# Patient Record
Sex: Female | Born: 1980 | Race: Black or African American | Hispanic: No | Marital: Single | Smoking: Former smoker
Health system: Southern US, Community
[De-identification: ages and names within clinical notes are randomized; demographics above are authoritative.]

## PROBLEM LIST (undated history)

## (undated) DIAGNOSIS — O24419 Gestational diabetes mellitus in pregnancy, unspecified control: Secondary | ICD-10-CM

## (undated) DIAGNOSIS — I10 Essential (primary) hypertension: Secondary | ICD-10-CM

## (undated) HISTORY — DX: Gestational diabetes mellitus in pregnancy, unspecified control: O24.419

## (undated) HISTORY — DX: Essential (primary) hypertension: I10

## (undated) HISTORY — PX: APPENDECTOMY: SHX54

---

## 2015-11-03 DIAGNOSIS — L680 Hirsutism: Secondary | ICD-10-CM | POA: Insufficient documentation

## 2017-08-27 ENCOUNTER — Encounter: Payer: Self-pay | Admitting: *Deleted

## 2017-09-15 ENCOUNTER — Ambulatory Visit (INDEPENDENT_AMBULATORY_CARE_PROVIDER_SITE_OTHER): Payer: Medicaid - Out of State | Admitting: Obstetrics and Gynecology

## 2017-09-15 ENCOUNTER — Ambulatory Visit: Payer: Self-pay

## 2017-09-15 ENCOUNTER — Other Ambulatory Visit (HOSPITAL_COMMUNITY)
Admission: RE | Admit: 2017-09-15 | Discharge: 2017-09-15 | Disposition: A | Discharge status: Home / Self Care | Payer: Medicaid - Out of State | Source: Ambulatory Visit | Attending: Obstetrics and Gynecology | Admitting: Obstetrics and Gynecology

## 2017-09-15 ENCOUNTER — Encounter: Payer: Self-pay | Admitting: Obstetrics and Gynecology

## 2017-09-15 VITALS — BP 129/73 | HR 92 | Wt 311.0 lb

## 2017-09-15 DIAGNOSIS — O09522 Supervision of elderly multigravida, second trimester: Secondary | ICD-10-CM | POA: Insufficient documentation

## 2017-09-15 DIAGNOSIS — Z3687 Encounter for antenatal screening for uncertain dates: Secondary | ICD-10-CM

## 2017-09-15 DIAGNOSIS — Z3482 Encounter for supervision of other normal pregnancy, second trimester: Secondary | ICD-10-CM | POA: Diagnosis present

## 2017-09-15 DIAGNOSIS — Z3A17 17 weeks gestation of pregnancy: Secondary | ICD-10-CM | POA: Insufficient documentation

## 2017-09-15 DIAGNOSIS — O09299 Supervision of pregnancy with other poor reproductive or obstetric history, unspecified trimester: Secondary | ICD-10-CM

## 2017-09-15 DIAGNOSIS — O3680X0 Pregnancy with inconclusive fetal viability, not applicable or unspecified: Secondary | ICD-10-CM | POA: Diagnosis not present

## 2017-09-15 DIAGNOSIS — O99212 Obesity complicating pregnancy, second trimester: Secondary | ICD-10-CM

## 2017-09-15 DIAGNOSIS — O99210 Obesity complicating pregnancy, unspecified trimester: Secondary | ICD-10-CM

## 2017-09-15 DIAGNOSIS — Z8632 Personal history of gestational diabetes: Secondary | ICD-10-CM | POA: Insufficient documentation

## 2017-09-15 DIAGNOSIS — Z3400 Encounter for supervision of normal first pregnancy, unspecified trimester: Secondary | ICD-10-CM | POA: Insufficient documentation

## 2017-09-15 DIAGNOSIS — O09292 Supervision of pregnancy with other poor reproductive or obstetric history, second trimester: Secondary | ICD-10-CM | POA: Diagnosis not present

## 2017-09-15 DIAGNOSIS — Z98891 History of uterine scar from previous surgery: Secondary | ICD-10-CM

## 2017-09-15 DIAGNOSIS — Z3402 Encounter for supervision of normal first pregnancy, second trimester: Secondary | ICD-10-CM | POA: Diagnosis present

## 2017-09-15 NOTE — Progress Notes (Signed)
Pt informed that the ultrasound is considered a limited OB ultrasound and is not intended to be a complete ultrasound exam.  Patient also informed that the ultrasound is not being completed with the intent of assessing for fetal or placental anomalies or any pelvic abnormalities.  Explained that the purpose of today's ultrasound is to assess for  viability.  Patient acknowledges the purpose of the exam and the limitations of the study.    

## 2017-09-15 NOTE — Progress Notes (Signed)
INITIAL PRENATAL VISIT NOTE  Subjective:  Miranda Ruiz is a 37 y.o. G2P1001 at [redacted]w[redacted]d by LMP confirmed by [name] today being seen today for her initial prenatal visit. This is an unplanned pregnancy. She and partner are happy with the pregnancy. She was using Mirena for birth control previously, taken out 04/2016. She has an obstetric history significant for 1CS with GDMA2 on insulin. She has a medical history significant for n/a.  Patient reports pelvic pressure.  Contractions: Not present. Vag. Bleeding: None.  Movement: Present. Denies leaking of fluid.   Past Medical History:  Diagnosis Date  . Gestational diabetes   on insulin  Past Surgical History:  Procedure Laterality Date  . APPENDECTOMY    . CESAREAN SECTION      OB History  Gravida Para Term Preterm AB Living  2 1 1  0 0 1  SAB TAB Ectopic Multiple Live Births  0 0 0 0 1    # Outcome Date GA Lbr Len/2nd Weight Sex Delivery Anes PTL Lv  2 Current           1 Term 2008     CS-Unspec   LIV    Social History   Socioeconomic History  . Marital status: Single    Spouse name: Not on file  . Number of children: Not on file  . Years of education: Not on file  . Highest education level: Not on file  Occupational History  . Not on file  Social Needs  . Financial resource strain: Not on file  . Food insecurity:    Worry: Not on file    Inability: Not on file  . Transportation needs:    Medical: Not on file    Non-medical: Not on file  Tobacco Use  . Smoking status: Former Smoker    Last attempt to quit: 05/18/2017    Years since quitting: 0.3  . Smokeless tobacco: Never Used  Substance and Sexual Activity  . Alcohol use: Never    Frequency: Never  . Drug use: Never  . Sexual activity: Yes    Birth control/protection: None  Lifestyle  . Physical activity:    Days per week: Not on file    Minutes per session: Not on file  . Stress: Not on file  Relationships  . Social connections:    Talks on phone: Not on  file    Gets together: Not on file    Attends religious service: Not on file    Active member of club or organization: Not on file    Attends meetings of clubs or organizations: Not on file    Relationship status: Not on file  Other Topics Concern  . Not on file  Social History Narrative  . Not on file   Family History  Problem Relation Age of Onset  . Cancer Maternal Aunt     (Not in a hospital admission)  Not on File  Review of Systems: Negative except for what is mentioned in HPI.  Objective:   Vitals:   09/15/17 0916  BP: 129/73  Pulse: 92  Weight: (!) 311 lb (141.1 kg)   Fetal Status:     Movement: Present   17w4 day by [name] today, FHR 150s  Physical Exam: BP 129/73   Pulse 92   Wt (!) 311 lb (141.1 kg)   LMP 05/18/2017 (Approximate)  CONSTITUTIONAL: Well-developed, well-nourished female in no acute distress.  NEUROLOGIC: Alert and oriented to person, place, and time. Normal reflexes, muscle  tone coordination. No cranial nerve deficit noted. PSYCHIATRIC: Normal mood and affect. Normal behavior. Normal judgment and thought content. SKIN: Skin is warm and dry. No rash noted. Not diaphoretic. No erythema. No pallor. HENT:  Normocephalic, atraumatic, External right and left ear normal. Oropharynx is clear and moist EYES: Conjunctivae and EOM are normal. Pupils are equal, round, and reactive to light. No scleral icterus.  NECK: Normal range of motion, supple, no masses CARDIOVASCULAR: Normal heart rate noted, regular rhythm RESPIRATORY: Effort and breath sounds normal, no problems with respiration noted BREASTS: symmetric, non-tender, no masses palpable ABDOMEN: Soft, nontender, nondistended, gravid. GU: normal appearing external female genitalia, multiparous normal appearing cervix, scant white discharge in vagina, no lesions noted Bimanual: difficult to palpate uterus due to habitus MUSCULOSKELETAL: Normal range of motion. EXT:  No edema and no tenderness. 2+ distal  pulses.   Assessment and Plan:  Pregnancy: G1P0 at 602w1d by LMP confirmed by US today  1. Encounter to determine fetal viability of pregnancy, single or unspecified fetus - US OB Limited; Future  1. Supervision of normal first pregnancy, antepartum - [facility] AMB BABYSCRIPTS OPT IN - Culture, OB Urine - Cystic fibrosis gene test - Hemoglobinopathy Evaluation - Obstetric Panel, Including HIV - SMN1 COPY NUMBER ANALYSIS (SMA Carrier Screen) - Genetic Screening  2. H/O gestational diabetes in prior pregnancy, currently pregnant On insulin Early A1C  3. Obesity during pregnancy  4. H/O: C-section States didn't dilate Would like RCS    Preterm labor symptoms and general obstetric precautions including but not limited to vaginal bleeding, contractions, leaking of fluid and fetal movement were reviewed in detail with the patient.  Please refer to After Visit Summary for other counseling recommendations.   Return in about 1 month (around 10/13/2017) for OB visit (MD).  Conan BowensKelly M Nizar Cutler 09/15/2017 1:56 PM

## 2017-09-16 ENCOUNTER — Encounter (HOSPITAL_COMMUNITY): Payer: Self-pay

## 2017-09-16 LAB — CYTOLOGY - PAP
CHLAMYDIA, DNA PROBE: NEGATIVE
Diagnosis: NEGATIVE
HPV: NOT DETECTED
Neisseria Gonorrhea: NEGATIVE

## 2017-09-17 LAB — CULTURE, OB URINE

## 2017-09-17 LAB — URINE CULTURE, OB REFLEX

## 2017-09-23 LAB — SMN1 COPY NUMBER ANALYSIS (SMA CARRIER SCREENING)

## 2017-09-23 LAB — HEMOGLOBINOPATHY EVALUATION
Ferritin: 122 ng/mL (ref 15–150)
HGB A: 97.6 % (ref 96.4–98.8)
HGB C: 0 %
HGB S: 0 %
HGB VARIANT: 0 %
Hgb A2 Quant: 2.4 % (ref 1.8–3.2)
Hgb F Quant: 0 % (ref 0.0–2.0)
Hgb Solubility: NEGATIVE

## 2017-09-23 LAB — OBSTETRIC PANEL, INCLUDING HIV
ANTIBODY SCREEN: NEGATIVE
Basophils Absolute: 0 10*3/uL (ref 0.0–0.2)
Basos: 0 %
EOS (ABSOLUTE): 0.1 10*3/uL (ref 0.0–0.4)
EOS: 2 %
HEMOGLOBIN: 10.9 g/dL — AB (ref 11.1–15.9)
HIV Screen 4th Generation wRfx: NONREACTIVE
Hematocrit: 32.7 % — ABNORMAL LOW (ref 34.0–46.6)
Hepatitis B Surface Ag: NEGATIVE
IMMATURE GRANS (ABS): 0 10*3/uL (ref 0.0–0.1)
Immature Granulocytes: 0 %
LYMPHS ABS: 1.4 10*3/uL (ref 0.7–3.1)
LYMPHS: 25 %
MCH: 29.9 pg (ref 26.6–33.0)
MCHC: 33.3 g/dL (ref 31.5–35.7)
MCV: 90 fL (ref 79–97)
MONOS ABS: 0.3 10*3/uL (ref 0.1–0.9)
Monocytes: 6 %
Neutrophils Absolute: 3.6 10*3/uL (ref 1.4–7.0)
Neutrophils: 67 %
Platelets: 265 10*3/uL (ref 150–450)
RBC: 3.64 x10E6/uL — AB (ref 3.77–5.28)
RDW: 12.9 % (ref 12.3–15.4)
RH TYPE: POSITIVE
RPR Ser Ql: NONREACTIVE
Rubella Antibodies, IGG: 2.34 index (ref >0.99)
WBC: 5.4 10*3/uL (ref 3.4–10.8)

## 2017-09-23 LAB — CYSTIC FIBROSIS GENE TEST

## 2017-09-23 LAB — HEMOGLOBIN A1C
ESTIMATED AVERAGE GLUCOSE: 131 mg/dL
Hgb A1c MFr Bld: 6.2 % — ABNORMAL HIGH (ref 4.8–5.6)

## 2017-09-24 ENCOUNTER — Ambulatory Visit (HOSPITAL_COMMUNITY)
Admission: RE | Admit: 2017-09-24 | Discharge: 2017-09-24 | Disposition: A | Discharge status: Home / Self Care | Payer: Medicaid - Out of State | Source: Ambulatory Visit | Attending: Obstetrics and Gynecology | Admitting: Obstetrics and Gynecology

## 2017-09-24 ENCOUNTER — Encounter (HOSPITAL_COMMUNITY): Payer: Self-pay

## 2017-09-24 ENCOUNTER — Other Ambulatory Visit (HOSPITAL_COMMUNITY): Payer: Self-pay | Admitting: *Deleted

## 2017-09-24 ENCOUNTER — Other Ambulatory Visit: Payer: Self-pay | Admitting: Obstetrics and Gynecology

## 2017-09-24 DIAGNOSIS — Z98891 History of uterine scar from previous surgery: Secondary | ICD-10-CM

## 2017-09-24 DIAGNOSIS — Z3A18 18 weeks gestation of pregnancy: Secondary | ICD-10-CM | POA: Diagnosis not present

## 2017-09-24 DIAGNOSIS — O09299 Supervision of pregnancy with other poor reproductive or obstetric history, unspecified trimester: Secondary | ICD-10-CM

## 2017-09-24 DIAGNOSIS — O3680X0 Pregnancy with inconclusive fetal viability, not applicable or unspecified: Secondary | ICD-10-CM

## 2017-09-24 DIAGNOSIS — Z363 Encounter for antenatal screening for malformations: Secondary | ICD-10-CM | POA: Diagnosis not present

## 2017-09-24 DIAGNOSIS — O09522 Supervision of elderly multigravida, second trimester: Secondary | ICD-10-CM

## 2017-09-24 DIAGNOSIS — O99212 Obesity complicating pregnancy, second trimester: Secondary | ICD-10-CM | POA: Diagnosis not present

## 2017-10-13 ENCOUNTER — Encounter: Payer: Medicaid - Out of State | Admitting: Advanced Practice Midwife

## 2017-10-13 ENCOUNTER — Other Ambulatory Visit: Payer: Medicaid - Out of State

## 2017-10-15 ENCOUNTER — Encounter: Payer: Self-pay | Admitting: *Deleted

## 2017-10-20 ENCOUNTER — Other Ambulatory Visit: Payer: Self-pay | Admitting: *Deleted

## 2017-10-20 DIAGNOSIS — Z8632 Personal history of gestational diabetes: Secondary | ICD-10-CM

## 2017-10-20 DIAGNOSIS — O09299 Supervision of pregnancy with other poor reproductive or obstetric history, unspecified trimester: Secondary | ICD-10-CM

## 2017-10-20 DIAGNOSIS — O99210 Obesity complicating pregnancy, unspecified trimester: Secondary | ICD-10-CM

## 2017-10-20 DIAGNOSIS — Z3400 Encounter for supervision of normal first pregnancy, unspecified trimester: Secondary | ICD-10-CM

## 2017-10-24 ENCOUNTER — Other Ambulatory Visit: Payer: Medicaid - Out of State

## 2017-10-24 ENCOUNTER — Ambulatory Visit (INDEPENDENT_AMBULATORY_CARE_PROVIDER_SITE_OTHER): Payer: Medicaid - Out of State | Admitting: Advanced Practice Midwife

## 2017-10-24 ENCOUNTER — Encounter: Payer: Self-pay | Admitting: General Practice

## 2017-10-24 ENCOUNTER — Encounter: Payer: Self-pay | Admitting: Advanced Practice Midwife

## 2017-10-24 VITALS — BP 122/69 | HR 93 | Wt 314.1 lb

## 2017-10-24 DIAGNOSIS — R7303 Prediabetes: Secondary | ICD-10-CM

## 2017-10-24 DIAGNOSIS — O99210 Obesity complicating pregnancy, unspecified trimester: Secondary | ICD-10-CM

## 2017-10-24 DIAGNOSIS — O09529 Supervision of elderly multigravida, unspecified trimester: Secondary | ICD-10-CM

## 2017-10-24 DIAGNOSIS — Z8632 Personal history of gestational diabetes: Secondary | ICD-10-CM

## 2017-10-24 DIAGNOSIS — Z3400 Encounter for supervision of normal first pregnancy, unspecified trimester: Secondary | ICD-10-CM

## 2017-10-24 DIAGNOSIS — Z98891 History of uterine scar from previous surgery: Secondary | ICD-10-CM

## 2017-10-24 DIAGNOSIS — O09299 Supervision of pregnancy with other poor reproductive or obstetric history, unspecified trimester: Secondary | ICD-10-CM

## 2017-10-24 NOTE — Progress Notes (Signed)
   PRENATAL VISIT NOTE  Subjective:  Miranda Ruiz is a 37 y.o. G2P1001 at 5459w5d being seen today for ongoing prenatal care.  She is currently monitored for the following issues for this low-risk pregnancy and has Supervision of normal first pregnancy, antepartum; Obesity during pregnancy; H/O: C-section; H/O gestational diabetes in prior pregnancy, currently pregnant; and Advanced maternal age in multigravida on their problem list.  Patient reports no complaints. States she noticed "fishy" smell and dark urine yesterday but "drank more water and now urine looks normal". Contractions: Not present. Vag. Bleeding: None.  Movement: Present. Denies leaking of fluid.   The following portions of the patient's history were reviewed and updated as appropriate: allergies, current medications, past family history, past medical history, past social history, past surgical history and problem list. Problem list updated.  Objective:   Vitals:   10/24/17 0832  BP: 122/69  Pulse: 93  Weight: (!) 314 lb 1.6 oz (142.5 kg)    Fetal Status: Fetal Heart Rate (bpm): 146 Fundal Height: 23 cm Movement: Present     General:  Alert, oriented and cooperative. Patient is in no acute distress.  Skin: Skin is warm and dry. No rash noted.   Cardiovascular: Normal heart rate noted  Respiratory: Normal respiratory effort, no problems with respiration noted  Abdomen: Soft, gravid, appropriate for gestational age.  Pain/Pressure: Absent     Pelvic: Cervical exam deferred        Extremities: Normal range of motion.  Edema: Trace  Mental Status: Normal mood and affect. Normal behavior. Normal judgment and thought content.   Assessment and Plan:  Pregnancy: G2P1001 at 2959w5d  1. Prediabetes Early HbA1C results show as prediabetic, discussed following A2GDM diet from previous pregnancy 2 hour GTT today   2. Antepartum multigravida of advanced maternal age - Concentrated urine r/t low water intake, now resolved - AFP,  Serum, Open Spina Bifida - Followup US 11/05/2017  3. Obesity in pregnancy   4. Previous cesarean section - Continues to desire RCS  5. History of gestational diabetes - Previously A2GDM on insulin  Preterm labor symptoms and general obstetric precautions including but not limited to vaginal bleeding, contractions, leaking of fluid and fetal movement were reviewed in detail with the patient. Please refer to After Visit Summary for other counseling recommendations.  No follow-ups on file.  Future Appointments  Date Time Provider Department Center  11/05/2017 10:30 AM [facility] US 1 [facility]    Calvert CantorSamantha C Zahra Peffley, CNM  10/24/17  8:54 AM

## 2017-10-24 NOTE — Progress Notes (Signed)
Pt states yesetrday when urinating she said her urine smelled fishy & was darker than usual. She drunk more water and has noticed the smell & urine is no longer dark.

## 2017-10-24 NOTE — Patient Instructions (Signed)
Prediabetes Prediabetes is the condition of having a blood sugar (blood glucose) level that is higher than it should be, but not high enough for you to be diagnosed with type 2 diabetes. Having prediabetes puts you at risk for developing type 2 diabetes (type 2 diabetes mellitus). Prediabetes may be called impaired glucose tolerance or impaired fasting glucose. Prediabetes usually does not cause symptoms. Your health care provider can diagnose this condition with blood tests. You may be tested for prediabetes if you are overweight and if you have at least one other risk factor for prediabetes. Risk factors for prediabetes include:  Having a family member with type 2 diabetes.  Being overweight or obese.  Being older than age 45.  Being of American-Indian, African-American, Hispanic/Latino, or Asian/Pacific Islander descent.  Having an inactive (sedentary) lifestyle.  Having a history of gestational diabetes or polycystic ovarian syndrome (PCOS).  Having low levels of good cholesterol (HDL-C) or high levels of blood fats (triglycerides).  Having high blood pressure.  What is blood glucose and how is blood glucose measured?  Blood glucose refers to the amount of glucose in your bloodstream. Glucose comes from eating foods that contain sugars and starches (carbohydrates) that the body breaks down into glucose. Your blood glucose level may be measured in mg/dL (milligrams per deciliter) or mmol/L (millimoles per liter).Your blood glucose may be checked with one or more of the following blood tests:  A fasting blood glucose (FBG) test. You will not be allowed to eat (you will fast) for at least 8 hours before a blood sample is taken. ? A normal range for FBG is 70-100 mg/dl (3.9-5.6 mmol/L).  An A1c (hemoglobin A1c) blood test. This test provides information about blood glucose control over the previous 2?3months.  An oral glucose tolerance test (OGTT). This test measures your blood  glucose twice: ? After fasting. This is your baseline level. ? Two hours after you drink a beverage that contains glucose.  You may be diagnosed with prediabetes:  If your FBG is 100?125 mg/dL (5.6-6.9 mmol/L).  If your A1c level is 5.7?6.4%.  If your OGGT result is 140?199 mg/dL (7.8-11 mmol/L).  These blood tests may be repeated to confirm your diagnosis. What happens if blood glucose is too high? The pancreas produces a hormone (insulin) that helps move glucose from the bloodstream into cells. When cells in the body do not respond properly to insulin that the body makes (insulin resistance), excess glucose builds up in the blood instead of going into cells. As a result, high blood glucose (hyperglycemia) can develop, which can cause many complications. This is a symptom of prediabetes. What can happen if blood glucose stays higher than normal for a long time? Having high blood glucose for a long time is dangerous. Too much glucose in your blood can damage your nerves and blood vessels. Long-term damage can lead to complications from diabetes, which may include:  Heart disease.  Stroke.  Blindness.  Kidney disease.  Depression.  Poor circulation in the feet and legs, which could lead to surgical removal (amputation) in severe cases.  How can prediabetes be prevented from turning into type 2 diabetes?  To help prevent type 2 diabetes, take the following actions:  Be physically active. ? Do moderate-intensity physical activity for at least 30 minutes on at least 5 days of the week, or as much as told by your health care provider. This could be brisk walking, biking, or water aerobics. ? Ask your health care provider what   activities are safe for you. A mix of physical activities may be best, such as walking, swimming, cycling, and strength training.  Lose weight as told by your health care provider. ? Losing 5-7% of your body weight can reverse insulin resistance. ? Your health  care provider can determine how much weight loss is best for you and can help you lose weight safely.  Follow a healthy meal plan. This includes eating lean proteins, complex carbohydrates, fresh fruits and vegetables, low-fat dairy products, and healthy fats. ? Follow instructions from your health care provider about eating or drinking restrictions. ? Make an appointment to see a diet and nutrition specialist (registered dietitian) to help you create a healthy eating plan that is right for you.  Do not smoke or use any tobacco products, such as cigarettes, chewing tobacco, and e-cigarettes. If you need help quitting, ask your health care provider.  Take over-the-counter and prescription medicines as told by your health care provider. You may be prescribed medicines that help lower the risk of type 2 diabetes.  This information is not intended to replace advice given to you by your health care provider. Make sure you discuss any questions you have with your health care provider. Document Released: 07/17/2015 Document Revised: 08/31/2015 Document Reviewed: 05/16/2015 Elsevier Interactive Patient Education  2018 [employer] Inc[employer]  

## 2017-10-26 LAB — AFP, SERUM, OPEN SPINA BIFIDA
AFP MoM: 1.1
AFP Value: 63.2 ng/mL
Gest. Age on Collection Date: 22.7 weeks
Maternal Age At EDD: 36.7 yr
OSBR Risk 1 IN: 10000
Test Results:: NEGATIVE
Weight: 314 [lb_av]

## 2017-10-26 LAB — GLUCOSE TOLERANCE, 2 HOURS W/ 1HR
Glucose, 1 hour: 193 mg/dL — ABNORMAL HIGH (ref 65–179)
Glucose, 2 hour: 163 mg/dL — ABNORMAL HIGH (ref 65–152)
Glucose, Fasting: 97 mg/dL — ABNORMAL HIGH (ref 65–91)

## 2017-10-28 ENCOUNTER — Encounter: Payer: Self-pay | Admitting: *Deleted

## 2017-10-28 ENCOUNTER — Telehealth: Payer: Self-pay | Admitting: *Deleted

## 2017-10-28 NOTE — Telephone Encounter (Signed)
-----   Message from Conan BowensKelly M Davis, MD sent at 10/27/2017  3:34 PM EDT ----- Please schedule for diabetes counseling.

## 2017-10-28 NOTE — Telephone Encounter (Signed)
Called pt and left message that I am calling regarding test results and an appt which is needed in our office. I stated that I will send a detailed message to her MyChart.  Pt had abnormal 2 hr GTT and needs Diabetes Education appt.

## 2017-10-29 ENCOUNTER — Encounter (INDEPENDENT_AMBULATORY_CARE_PROVIDER_SITE_OTHER): Payer: Self-pay

## 2017-11-04 ENCOUNTER — Other Ambulatory Visit: Payer: Medicaid - Out of State

## 2017-11-05 ENCOUNTER — Other Ambulatory Visit (HOSPITAL_COMMUNITY): Payer: Self-pay | Admitting: Maternal & Fetal Medicine

## 2017-11-05 ENCOUNTER — Encounter (HOSPITAL_COMMUNITY): Payer: Self-pay

## 2017-11-05 ENCOUNTER — Ambulatory Visit (HOSPITAL_COMMUNITY)
Admission: RE | Admit: 2017-11-05 | Discharge: 2017-11-05 | Disposition: A | Discharge status: Home / Self Care | Payer: Medicaid - Out of State | Source: Ambulatory Visit | Attending: Obstetrics and Gynecology | Admitting: Obstetrics and Gynecology

## 2017-11-05 ENCOUNTER — Other Ambulatory Visit (HOSPITAL_COMMUNITY): Payer: Self-pay | Admitting: *Deleted

## 2017-11-05 DIAGNOSIS — O24410 Gestational diabetes mellitus in pregnancy, diet controlled: Secondary | ICD-10-CM

## 2017-11-05 DIAGNOSIS — Z362 Encounter for other antenatal screening follow-up: Secondary | ICD-10-CM | POA: Insufficient documentation

## 2017-11-05 DIAGNOSIS — O09522 Supervision of elderly multigravida, second trimester: Secondary | ICD-10-CM | POA: Diagnosis not present

## 2017-11-05 DIAGNOSIS — Z0489 Encounter for examination and observation for other specified reasons: Secondary | ICD-10-CM

## 2017-11-05 DIAGNOSIS — Z3A24 24 weeks gestation of pregnancy: Secondary | ICD-10-CM | POA: Insufficient documentation

## 2017-11-05 DIAGNOSIS — O99212 Obesity complicating pregnancy, second trimester: Secondary | ICD-10-CM

## 2017-11-05 DIAGNOSIS — IMO0002 Reserved for concepts with insufficient information to code with codable children: Secondary | ICD-10-CM

## 2017-11-24 ENCOUNTER — Encounter: Payer: Self-pay | Admitting: *Deleted

## 2017-11-24 ENCOUNTER — Other Ambulatory Visit (HOSPITAL_COMMUNITY)
Admission: RE | Admit: 2017-11-24 | Discharge: 2017-11-24 | Disposition: A | Discharge status: Home / Self Care | Payer: Medicaid - Out of State | Source: Ambulatory Visit | Attending: Family Medicine | Admitting: Family Medicine

## 2017-11-24 ENCOUNTER — Ambulatory Visit (INDEPENDENT_AMBULATORY_CARE_PROVIDER_SITE_OTHER): Payer: Medicaid - Out of State | Admitting: *Deleted

## 2017-11-24 DIAGNOSIS — N898 Other specified noninflammatory disorders of vagina: Secondary | ICD-10-CM

## 2017-11-24 NOTE — Progress Notes (Signed)
I have reviewed the chart and agree with nursing staff's documentation of this patient's encounter.  Thressa ShellerHeather Zahriyah Joo, CNM 11/24/2017 5:52 PM

## 2017-11-24 NOTE — Progress Notes (Signed)
Pt reports that she began using Summer's Eve feminine wipes and spray 3 days ago due to having excessive perspiration. Then yesterday she felt as though she might be developing a yeast infection and used OTC Terazol cream. Today her labia and outer vaginal area was very swollen and irritated. Pt also reports that she had sex 8 days ago and would like to be tested for STI.  Pt performed self vaginal swab. She will be notified of results via MyChart.

## 2017-11-26 ENCOUNTER — Telehealth: Payer: Self-pay

## 2017-11-26 ENCOUNTER — Encounter: Payer: Medicaid - Out of State | Admitting: Advanced Practice Midwife

## 2017-11-26 LAB — CERVICOVAGINAL ANCILLARY ONLY
BACTERIAL VAGINITIS: NEGATIVE
CANDIDA VAGINITIS: POSITIVE — AB
Chlamydia: NEGATIVE
Neisseria Gonorrhea: NEGATIVE
Trichomonas: NEGATIVE

## 2017-11-26 MED ORDER — TERCONAZOLE 0.4 % VA CREA: 1.0000 | TOPICAL_CREAM | Freq: Every day | VAGINAL | 0 refills | Status: DC

## 2017-11-26 NOTE — Telephone Encounter (Signed)
Pt called requesting test results.  LM for pt that she can obtain her results on MyChart and we have sent in a prescription to her pharmacy.  MyChart message sent.

## 2017-11-27 ENCOUNTER — Ambulatory Visit: Payer: PRIVATE HEALTH INSURANCE | Admitting: *Deleted

## 2017-12-02 ENCOUNTER — Encounter (HOSPITAL_COMMUNITY): Payer: Self-pay

## 2017-12-02 ENCOUNTER — Ambulatory Visit (HOSPITAL_COMMUNITY)
Admission: RE | Admit: 2017-12-02 | Discharge: 2017-12-02 | Disposition: A | Discharge status: Home / Self Care | Payer: PRIVATE HEALTH INSURANCE | Source: Ambulatory Visit | Attending: Obstetrics and Gynecology | Admitting: Obstetrics and Gynecology

## 2017-12-02 ENCOUNTER — Ambulatory Visit: Payer: PRIVATE HEALTH INSURANCE | Admitting: *Deleted

## 2017-12-02 ENCOUNTER — Other Ambulatory Visit (HOSPITAL_COMMUNITY): Payer: Self-pay | Admitting: *Deleted

## 2017-12-02 ENCOUNTER — Encounter: Payer: PRIVATE HEALTH INSURANCE | Attending: Family Medicine | Admitting: *Deleted

## 2017-12-02 ENCOUNTER — Ambulatory Visit (INDEPENDENT_AMBULATORY_CARE_PROVIDER_SITE_OTHER): Payer: PRIVATE HEALTH INSURANCE | Admitting: Advanced Practice Midwife

## 2017-12-02 VITALS — BP 107/60 | HR 96 | Wt 321.8 lb

## 2017-12-02 DIAGNOSIS — Z3A28 28 weeks gestation of pregnancy: Secondary | ICD-10-CM | POA: Diagnosis not present

## 2017-12-02 DIAGNOSIS — O24410 Gestational diabetes mellitus in pregnancy, diet controlled: Secondary | ICD-10-CM | POA: Insufficient documentation

## 2017-12-02 DIAGNOSIS — Z23 Encounter for immunization: Secondary | ICD-10-CM

## 2017-12-02 DIAGNOSIS — Z3400 Encounter for supervision of normal first pregnancy, unspecified trimester: Secondary | ICD-10-CM

## 2017-12-02 DIAGNOSIS — O24419 Gestational diabetes mellitus in pregnancy, unspecified control: Secondary | ICD-10-CM | POA: Insufficient documentation

## 2017-12-02 DIAGNOSIS — O09293 Supervision of pregnancy with other poor reproductive or obstetric history, third trimester: Secondary | ICD-10-CM

## 2017-12-02 DIAGNOSIS — O09523 Supervision of elderly multigravida, third trimester: Secondary | ICD-10-CM | POA: Diagnosis not present

## 2017-12-02 DIAGNOSIS — Z713 Dietary counseling and surveillance: Secondary | ICD-10-CM | POA: Diagnosis not present

## 2017-12-02 DIAGNOSIS — O34219 Maternal care for unspecified type scar from previous cesarean delivery: Secondary | ICD-10-CM

## 2017-12-02 DIAGNOSIS — Z98891 History of uterine scar from previous surgery: Secondary | ICD-10-CM

## 2017-12-02 DIAGNOSIS — Z3A00 Weeks of gestation of pregnancy not specified: Secondary | ICD-10-CM | POA: Diagnosis not present

## 2017-12-02 MED ORDER — ACCU-CHEK FASTCLIX LANCETS MISC: 1.0000 | Freq: Four times a day (QID) | 12 refills | Status: DC

## 2017-12-02 MED ORDER — ACCU-CHEK GUIDE ME W/DEVICE KIT: 1.0000 | PACK | Freq: Four times a day (QID) | 0 refills | Status: DC

## 2017-12-02 MED ORDER — GLUCOSE BLOOD VI STRP: ORAL_STRIP | 12 refills | Status: DC

## 2017-12-02 NOTE — Patient Instructions (Signed)
Gestational Diabetes Mellitus, Self Care Caring for yourself after you have been diagnosed with gestational diabetes (gestational diabetes mellitus) means keeping your blood sugar (glucose) under control with a balance of:  Nutrition.  Exercise.  Lifestyle changes.  Medicines or insulin, if necessary.  Support from your team of health care providers and others.  The following information explains what you need to know to manage your gestational diabetes at home. What do I need to do to manage my blood glucose?  Check your blood glucose every day during your pregnancy. Do this as often as told by your health care provider.  Contact your health care provider if your blood glucose is above your target for 2 tests in a row. Your health care provider will set individualized treatment goals for you. Generally, the goal of treatment is to maintain the following blood glucose levels during pregnancy:  After not eating for 8 hours (after fasting): at or below 95 mg/dL (5.3 mmol/L).  After meals (postprandial): ? One hour after a meal: at or below 140 mg/dL (7.8 mmol/L). ? Two hours after a meal: at or below 120 mg/dL (6.7 mmol/L).  A1c (hemoglobin A1c) level: 6-6.5%.  What do I need to know about hyperglycemia and hypoglycemia? What is hyperglycemia? Hyperglycemia, also called high blood glucose, occurs when blood glucose is too high. Make sure you know the early signs of hyperglycemia, such as:  Increased thirst.  Hunger.  Feeling very tired.  Needing to urinate more often than usual.  Blurry vision.  What is hypoglycemia? Hypoglycemia, also called low blood glucose, occurswith a blood glucose level at or below 70 mg/dL (3.9 mmol/L). The risk for hypoglycemia increases during or after exercise, during sleep, during illness, and when skipping meals or not eating for a long time (fasting). It is important to know the symptoms of hypoglycemia and treat it right away. Always have a  15-gram rapid-acting carbohydrate snack with you to treat low blood glucose.Family members and close friends should also know the symptoms and should understand how to treat hypoglycemia, in case you are not able to treat yourself. What are the symptoms of hypoglycemia? Hypoglycemia symptoms can include:  Hunger.  Anxiety.  Sweating and feeling clammy.  Confusion.  Dizziness or feeling light-headed.  Sleepiness.  Nausea.  Increased heart rate.  Headache.  Blurry vision.  Seizure.  Nightmares.  Tingling or numbness around the mouth, lips, or tongue.  A change in speech.  Decreased ability to concentrate.  A change in coordination.  Restless sleep.  Tremors or shakes.  Fainting.  Irritability.  How do I treat hypoglycemia?  If you are alert and able to swallow safely, follow the 15:15 rule:  Take 15 grams of a rapid-acting carbohydrate. Rapid-acting options include: ? 1 tube of glucose gel. ? 3 glucose pills. ? 6-8 pieces of hard candy. ? 4 oz (120 mL) of fruit juice. ? 4 oz (120 mL) of regular (not diet) soda.  Check your blood glucose 15 minutes after you take the carbohydrate.  If the repeat blood glucose level is still at or below 70 mg/dL (3.9 mmol/L), take 15 grams of a carbohydrate again.  If your blood glucose level does not increase above 70 mg/dL (3.9 mmol/L) after 3 tries, seek emergency medical care.  After your blood glucose level returns to normal, eat a meal or a snack within 1 hour.  How do I treat severe hypoglycemia? Severe hypoglycemia is when your blood glucose level is at or below 54 mg/dL (  3 mmol/L). Severe hypoglycemia is an emergency. Do not wait to see if the symptoms will go away. Get medical help right away. Call your local emergency services (911 in the [place] Do not drive yourself to the hospital. If you have severe hypoglycemia and you cannot eat or drink, you may need an injection of glucagon. A family member or close  friend should learn how to check your blood glucose and how to give you a glucagon injection. Ask your health care provider if you need to have an emergency glucagon injection kit available. Severe hypoglycemia may need to be treated in a hospital. The treatment may include getting glucose through an IV tube. You may also need treatment for the cause of your hypoglycemia. What else can I do to manage my gestational diabetes? Take your diabetes medicines as told  If your health care provider prescribed insulin or diabetes medicines, take them every day.  Do not run out of insulin or other diabetes medicines that you take. Plan ahead so you always have these available.  If you use insulin, adjust your dosage based on how physically active you are and what foods you eat. Your health care provider will tell you how to adjust your dosage. Make healthy food choices  The things that you eat and drink affect your blood glucose. Making good choices helps to control your diabetes and prevent other health problems. A healthy meal plan includes eating lean proteins, complex carbohydrates, fresh fruits and vegetables, low-fat dairy products, and healthy fats. Make an appointment to see a diet and nutrition specialist (registered dietitian) to help you create an eating plan that is right for you. Make sure that you:  Follow instructions from your health care provider about eating or drinking restrictions.  Drink enough fluid to keep your urine clear or pale yellow.  Eat healthy snacks between nutritious meals.  Track the carbohydrates that you eat. Do this by reading food labels and learning the standard serving sizes of foods.  Follow your sick day plan whenever you cannot eat or drink as usual. Make this plan in advance with your health care provider.  Stay active   Do at least 30 minutes of physical activity a day, or as much physical activity as your health care provider recommends during your  pregnancy. ? Doing 10 minutes of exercise 30 minutes after each meal may help to control postprandial blood glucose levels.  If you start a new exercise or activity, work with your health care provider to adjust your insulin, medicines, or food intake as needed. Make healthy lifestyle choices  Do not drink alcohol.  Do not use any tobacco products, such as cigarettes, chewing tobacco, and e-cigarettes. If you need help quitting, ask your health care provider.  Learn to manage stress. If you need help with this, ask your health care provider. Care for your body  Keep your immunizations up to date.  Brush your teeth and gums two times a day, and floss at least one time a day.  Visit your dentist at least once every 6 months.  Maintain a healthy weight during your pregnancy. General instructions   Take over-the-counter and prescription medicines only as told by your health care provider.  Talk with your health care provider about your risk for high blood pressure during pregnancy (preeclampsia or eclampsia).  Share your diabetes management plan with people in your workplace, school, and household.  Check your urine for ketones during your pregnancy when you are ill and   as told by your health care provider.  Carry a medical alert card or wear medical alert jewelry.  Ask your health care provider: ? Do I need to meet with a diabetes educator? ? Where can I find a support group for people with diabetes?  Keep all follow-up visits during your pregnancy (prenatal) and after delivery (postnatal) as told by your health care provider. This is important. Get the care that you need after delivery  Have your blood glucose level checked 4-12 weeks after delivery. This is done with an oral glucose tolerance test (OGTT).  Get screened for diabetes at least every 3 years, or as often as told by your health care provider. Where to find more information: To learn more about gestational  diabetes, visit:  American Diabetes Association (ADA): www.diabetes.org/diabetes-basics/gestational  Centers for Disease Control and Prevention (CDC): www.cdc.gov/diabetes/pubs/pdf/gestationalDiabetes.pdf  This information is not intended to replace advice given to you by your health care provider. Make sure you discuss any questions you have with your health care provider. Document Released: 07/17/2015 Document Revised: 08/31/2015 Document Reviewed: 04/28/2015 Elsevier Interactive Patient Education  2018 [employer] Inc.  

## 2017-12-02 NOTE — Addendum Note (Signed)
Addended by: Ernestina PatchesAPEL, Qamar Rosman S on: 12/02/2017 11:02 AM   Modules accepted: Orders

## 2017-12-02 NOTE — Progress Notes (Signed)
   PRENATAL VISIT NOTE  Subjective:  Miranda Ruiz is a 37 y.o. G2P1001 at 5322w2d being seen today for ongoing prenatal care.  She is currently monitored for the following issues for this high-risk pregnancy and has Supervision of normal first pregnancy, antepartum; Obesity during pregnancy; H/O: C-section; H/O gestational diabetes in prior pregnancy, currently pregnant; Advanced maternal age in multigravida; Prediabetes; and Gestational diabetes on their problem list.  Patient reports no complaints.  Contractions: Not present. Vag. Bleeding: None.  Movement: Present. Denies leaking of fluid.   The following portions of the patient's history were reviewed and updated as appropriate: allergies, current medications, past family history, past medical history, past social history, past surgical history and problem list. Problem list updated.  Objective:   Vitals:   12/02/17 0936  BP: 107/60  Pulse: 96  Weight: (!) 146 kg    Fetal Status: Fetal Heart Rate (bpm): 158 Fundal Height: 40 cm Movement: Present     General:  Alert, oriented and cooperative. Patient is in no acute distress.  Skin: Skin is warm and dry. No rash noted.   Cardiovascular: Normal heart rate noted  Respiratory: Normal respiratory effort, no problems with respiration noted  Abdomen: Soft, gravid, appropriate for gestational age.  Pain/Pressure: Absent     Pelvic: Cervical exam deferred        Extremities: Normal range of motion.  Edema: Trace  Mental Status: Normal mood and affect. Normal behavior. Normal judgment and thought content.   Assessment and Plan:  Pregnancy: G2P1001 at 6222w2d  1. Need for diphtheria-tetanus-pertussis (Tdap) vaccine  - Tdap vaccine greater than or equal to 7yo IM  2. Diet controlled gestational diabetes mellitus (GDM) in third trimester --Diabetic education today --Pt with GDM last pregnancy, discussed importance of good glycemic control during pregnancy.  3. Previous cesarean  section --Desires repeat --Anticipatory guidance about next visits/weeks of pregnancy given. --Contraceptive counseling today, LARCs discussed as most effective. Pt undecided.    Preterm labor symptoms and general obstetric precautions including but not limited to vaginal bleeding, contractions, leaking of fluid and fetal movement were reviewed in detail with the patient. Please refer to After Visit Summary for other counseling recommendations.  Return in about 2 weeks (around 12/16/2017).  Future Appointments  Date Time Provider Department Center  12/30/2017  8:00 AM WH-MFC US 3 WH-MFCUS MFC-US    Sharen CounterLisa Leftwich-Kirby, CNM

## 2017-12-02 NOTE — Progress Notes (Signed)
  Patient was seen on 12/02/2017 for Gestational Diabetes self-management. EDD 02/22/2018. Patient states history of GDM with last pregnancy 11 years ago. Diet history obtained. Patient eats poor variety of all food groups with most food being from fast food restaurants. Beverages include regular soda, fruit punch and water.  The following learning objectives were met by the patient :   States the definition of Gestational Diabetes  States why dietary management is important in controlling blood glucose  Describes the effects of carbohydrates on blood glucose levels  Demonstrates ability to create a balanced meal plan  Demonstrates carbohydrate counting   States when to check blood glucose levels  Demonstrates proper blood glucose monitoring techniques  States the effect of stress and exercise on blood glucose levels  States the importance of limiting caffeine and abstaining from alcohol and smoking  Plan:  Aim for 3 Carb Choices per meal (45 grams) +/- 1 either way  Aim for 1-2 Carbs per snack Begin reading food labels for Total Carbohydrate of foods If OK with your MD, consider  increasing your activity level by walking, Arm Chair Exercises or other activity daily as tolerated Begin checking BG before breakfast and 2 hours after first bite of breakfast, lunch and dinner as directed by MD  Bring Log Book/Sheet to every medical appointment   Baby Scripts:  Patient was introduced to [employer] and plans to use as record of BG electronically  and she states she would also like to record BG on Log Sheet Take medication if directed by MD  Blood glucose monitor Rx called into pharmacy: Accu Check Guide with Fast Clix drums Patient instructed to test pre breakfast and 2 hours each meal as directed by MD  Patient instructed to monitor glucose levels: FBS: 60 - 95 mg/dl 2 hour: <120 mg/dl  Patient received the following handouts:  Nutrition Diabetes and Pregnancy  Carbohydrate  Counting List  BG Log Sheet  Patient will be seen for follow-up as needed.

## 2017-12-16 ENCOUNTER — Encounter: Payer: PRIVATE HEALTH INSURANCE | Admitting: Student

## 2017-12-22 ENCOUNTER — Telehealth: Payer: Self-pay | Admitting: Family Medicine

## 2017-12-22 ENCOUNTER — Encounter: Payer: Self-pay | Admitting: *Deleted

## 2017-12-22 NOTE — Telephone Encounter (Signed)
Called patient with her appointment. She stated she canceled her last appointment because she had not gotten her stuff from the pharmacy, and didn't see a need in coming. She requested an after hours appointment. I informed her we don't have after hours appointments for OB patients. I informed her she could speak with Bev when she comes on Thursday. She stated she needed to check her schedule, and she will call me back later.

## 2017-12-25 ENCOUNTER — Encounter: Payer: PRIVATE HEALTH INSURANCE | Admitting: Family Medicine

## 2017-12-30 ENCOUNTER — Ambulatory Visit (HOSPITAL_COMMUNITY)
Admission: RE | Admit: 2017-12-30 | Discharge: 2017-12-30 | Disposition: A | Discharge status: Home / Self Care | Payer: PRIVATE HEALTH INSURANCE | Source: Ambulatory Visit | Attending: Obstetrics and Gynecology | Admitting: Obstetrics and Gynecology

## 2017-12-30 ENCOUNTER — Other Ambulatory Visit (HOSPITAL_COMMUNITY): Payer: Self-pay | Admitting: Obstetrics and Gynecology

## 2017-12-30 ENCOUNTER — Other Ambulatory Visit (HOSPITAL_COMMUNITY): Payer: Self-pay | Admitting: *Deleted

## 2017-12-30 ENCOUNTER — Encounter (HOSPITAL_COMMUNITY): Payer: Self-pay

## 2017-12-30 ENCOUNTER — Telehealth: Payer: Self-pay | Admitting: *Deleted

## 2017-12-30 ENCOUNTER — Ambulatory Visit (INDEPENDENT_AMBULATORY_CARE_PROVIDER_SITE_OTHER): Payer: PRIVATE HEALTH INSURANCE | Admitting: Obstetrics & Gynecology

## 2017-12-30 ENCOUNTER — Encounter: Payer: Self-pay | Admitting: Family Medicine

## 2017-12-30 VITALS — Wt 322.4 lb

## 2017-12-30 DIAGNOSIS — O24410 Gestational diabetes mellitus in pregnancy, diet controlled: Secondary | ICD-10-CM

## 2017-12-30 DIAGNOSIS — O09523 Supervision of elderly multigravida, third trimester: Secondary | ICD-10-CM

## 2017-12-30 DIAGNOSIS — Z3A32 32 weeks gestation of pregnancy: Secondary | ICD-10-CM | POA: Insufficient documentation

## 2017-12-30 DIAGNOSIS — O99213 Obesity complicating pregnancy, third trimester: Secondary | ICD-10-CM

## 2017-12-30 DIAGNOSIS — Z23 Encounter for immunization: Secondary | ICD-10-CM

## 2017-12-30 DIAGNOSIS — Z98891 History of uterine scar from previous surgery: Secondary | ICD-10-CM

## 2017-12-30 DIAGNOSIS — Z362 Encounter for other antenatal screening follow-up: Secondary | ICD-10-CM

## 2017-12-30 DIAGNOSIS — O09293 Supervision of pregnancy with other poor reproductive or obstetric history, third trimester: Secondary | ICD-10-CM | POA: Insufficient documentation

## 2017-12-30 DIAGNOSIS — O34219 Maternal care for unspecified type scar from previous cesarean delivery: Secondary | ICD-10-CM | POA: Diagnosis not present

## 2017-12-30 DIAGNOSIS — O99210 Obesity complicating pregnancy, unspecified trimester: Secondary | ICD-10-CM

## 2017-12-30 DIAGNOSIS — Z3400 Encounter for supervision of normal first pregnancy, unspecified trimester: Secondary | ICD-10-CM

## 2017-12-30 LAB — POCT URINALYSIS DIP (DEVICE)
Bilirubin Urine: NEGATIVE
Glucose, UA: NEGATIVE mg/dL
Hgb urine dipstick: NEGATIVE
Leukocytes, UA: NEGATIVE
Nitrite: NEGATIVE
PH: 6.5 (ref 5.0–8.0)
PROTEIN: NEGATIVE mg/dL
Specific Gravity, Urine: 1.015 (ref 1.005–1.030)
Urobilinogen, UA: 0.2 mg/dL (ref 0.0–1.0)

## 2017-12-30 LAB — HEMOGLOBIN A1C
Est. average glucose Bld gHb Est-mCnc: 143 mg/dL
HEMOGLOBIN A1C: 6.6 % — AB (ref 4.8–5.6)

## 2017-12-30 LAB — GLUCOSE, CAPILLARY: Glucose-Capillary: 109 mg/dL — ABNORMAL HIGH (ref 70–99)

## 2017-12-30 MED ORDER — ACCU-CHEK GUIDE ME W/DEVICE KIT: 1.0000 | PACK | Freq: Four times a day (QID) | 0 refills | Status: DC

## 2017-12-30 MED ORDER — GLUCOSE BLOOD VI STRP: ORAL_STRIP | 12 refills | Status: DC

## 2017-12-30 NOTE — ED Notes (Signed)
Pt states that she has attempted to pick up glucose testing supplies 3 times and her pharmacy does not have everything that was prescribed.  Plans to talk to her doctor today at her OB appointment.

## 2017-12-30 NOTE — Progress Notes (Signed)
   PRENATAL VISIT NOTE  Subjective:  Miranda Ruiz is a 37 y.o. G2P1001 at 4950w2d being seen today for ongoing prenatal care.  She is currently monitored for the following issues for this high-risk pregnancy and has Supervision of normal first pregnancy, antepartum; Obesity during pregnancy; H/O: C-section; H/O gestational diabetes in prior pregnancy, currently pregnant; Advanced maternal age in multigravida; Prediabetes; and Gestational diabetes on their problem list.  Patient reports no complaints.  Contractions: Not present. Vag. Bleeding: None.  Movement: Present. Denies leaking of fluid.   The following portions of the patient's history were reviewed and updated as appropriate: allergies, current medications, past family history, past medical history, past social history, past surgical history and problem list. Problem list updated.  Objective:   Vitals:   12/30/17 1031  Weight: (!) 322 lb 6.4 oz (146.2 kg)    Fetal Status:     Movement: Present     General:  Alert, oriented and cooperative. Patient is in no acute distress.  Skin: Skin is warm and dry. No rash noted.   Cardiovascular: Normal heart rate noted  Respiratory: Normal respiratory effort, no problems with respiration noted  Abdomen: Soft, gravid, appropriate for gestational age.  Pain/Pressure: Absent     Pelvic: Cervical exam deferred        Extremities: Normal range of motion.  Edema: Trace  Mental Status: Normal mood and affect. Normal behavior. Normal judgment and thought content.   Assessment and Plan:  Pregnancy: G2P1001 at 6450w2d  1. Multigravida of advanced maternal age in third trimester   2. Supervision of normal other pregnancy, antepartum   3. Obesity during pregnancy   4. Diet controlled gestational diabetes mellitus (GDM) in third trimester - not checking sugars as she has not been able to get the supplies - I reordered all supplies and the diabetes coordinator is calling the pharmacy. - Hemoglobin  A1c  5. Previous cesarean section - wants a RLTCS  Preterm labor symptoms and general obstetric precautions including but not limited to vaginal bleeding, contractions, leaking of fluid and fetal movement were reviewed in detail with the patient. Please refer to After Visit Summary for other counseling recommendations.  No follow-ups on file.  Future Appointments  Date Time Provider Department Center  01/09/2018  3:45 PM WH-MFC US 2 WH-MFCUS MFC-US  01/16/2018  4:00 PM WH-MFC US 3 WH-MFCUS MFC-US  01/23/2018  4:00 PM WH-MFC US 3 WH-MFCUS MFC-US  01/30/2018  4:00 PM WH-MFC US 3 WH-MFCUS MFC-US    Allie BossierMyra C Ellawyn Wogan, MD

## 2018-01-07 ENCOUNTER — Encounter: Payer: Self-pay | Admitting: General Practice

## 2018-01-09 ENCOUNTER — Ambulatory Visit (HOSPITAL_COMMUNITY): Payer: PRIVATE HEALTH INSURANCE

## 2018-01-09 ENCOUNTER — Encounter (HOSPITAL_COMMUNITY): Payer: Self-pay

## 2018-01-09 ENCOUNTER — Encounter: Payer: PRIVATE HEALTH INSURANCE | Admitting: Obstetrics and Gynecology

## 2018-01-12 ENCOUNTER — Encounter: Payer: Self-pay | Admitting: *Deleted

## 2018-01-12 ENCOUNTER — Encounter: Payer: PRIVATE HEALTH INSURANCE | Admitting: Obstetrics and Gynecology

## 2018-01-12 NOTE — Progress Notes (Signed)
Patient came to window wanting to speak to a nurse. States she wants to be taken out of work because she is GDM , and is under stress and work wants her to make up her time at work when she misses for appointments but she can't because she doesn't have child care.  She states she feels her blood sugars are high because she is stressed. I asked her what her cbg's are and she states she isn't taking them.  I informed her we can not take her out of work except for a medical reason and that GDM is not a reason. I explained she needs to be taking her cbg's and bringing her log with her to her appointments. I also explained she can discuss with provider at her next ob visit this week on 01/16/18.  I informed her I understand she is under stress and some jobs don't work with pregnant women but we can't take her out of work for that. I explained we can give her a letter verifying pregnancy and that she needs to check her blood sugars while at work. She declined. She voices understanding.

## 2018-01-16 ENCOUNTER — Ambulatory Visit (HOSPITAL_COMMUNITY): Payer: PRIVATE HEALTH INSURANCE

## 2018-01-16 ENCOUNTER — Encounter: Payer: PRIVATE HEALTH INSURANCE | Admitting: Obstetrics and Gynecology

## 2018-01-16 ENCOUNTER — Ambulatory Visit (HOSPITAL_COMMUNITY): Admission: RE | Admit: 2018-01-16 | Payer: PRIVATE HEALTH INSURANCE | Source: Ambulatory Visit

## 2018-01-21 ENCOUNTER — Encounter: Payer: Self-pay | Admitting: Obstetrics and Gynecology

## 2018-01-21 ENCOUNTER — Telehealth: Payer: Self-pay | Admitting: Obstetrics and Gynecology

## 2018-01-21 ENCOUNTER — Ambulatory Visit (INDEPENDENT_AMBULATORY_CARE_PROVIDER_SITE_OTHER): Payer: PRIVATE HEALTH INSURANCE | Admitting: Obstetrics and Gynecology

## 2018-01-21 VITALS — BP 132/79 | HR 104 | Wt 322.3 lb

## 2018-01-21 DIAGNOSIS — O24410 Gestational diabetes mellitus in pregnancy, diet controlled: Secondary | ICD-10-CM

## 2018-01-21 DIAGNOSIS — O09299 Supervision of pregnancy with other poor reproductive or obstetric history, unspecified trimester: Secondary | ICD-10-CM

## 2018-01-21 DIAGNOSIS — Z8632 Personal history of gestational diabetes: Secondary | ICD-10-CM

## 2018-01-21 DIAGNOSIS — O99210 Obesity complicating pregnancy, unspecified trimester: Secondary | ICD-10-CM

## 2018-01-21 DIAGNOSIS — Z98891 History of uterine scar from previous surgery: Secondary | ICD-10-CM

## 2018-01-21 DIAGNOSIS — O09523 Supervision of elderly multigravida, third trimester: Secondary | ICD-10-CM

## 2018-01-21 DIAGNOSIS — Z3400 Encounter for supervision of normal first pregnancy, unspecified trimester: Secondary | ICD-10-CM

## 2018-01-21 LAB — POCT URINALYSIS DIP (DEVICE)
Bilirubin Urine: NEGATIVE
Glucose, UA: NEGATIVE mg/dL
Hgb urine dipstick: NEGATIVE
Ketones, ur: NEGATIVE mg/dL
Leukocytes, UA: NEGATIVE
Nitrite: NEGATIVE
PH: 6.5 (ref 5.0–8.0)
PROTEIN: NEGATIVE mg/dL
Specific Gravity, Urine: 1.01 (ref 1.005–1.030)
UROBILINOGEN UA: 0.2 mg/dL (ref 0.0–1.0)

## 2018-01-21 NOTE — Progress Notes (Signed)
Pt states got sick after flu shot, gave list of medications she can take. Has not been really eating & hasn't checked blood sugars.

## 2018-01-21 NOTE — Progress Notes (Signed)
PRENATAL VISIT NOTE  Subjective:  Miranda Ruiz is a 37 y.o. G2P1001 at [redacted]w[redacted]d being seen today for ongoing prenatal care.  She is currently monitored for the following issues for this high-risk pregnancy and has Supervision of normal first pregnancy, antepartum; Obesity during pregnancy; H/O: C-section; H/O gestational diabetes in prior pregnancy, currently pregnant; Advanced maternal age in multigravida; Prediabetes; and Gestational diabetes on their problem list.  Patient reports no complaints.  Contractions: Not present. Vag. Bleeding: None.  Movement: Present. Denies leaking of fluid.   The following portions of the patient's history were reviewed and updated as appropriate: allergies, current medications, past family history, past medical history, past social history, past surgical history and problem list. Problem list updated.  Objective:   Vitals:   01/21/18 1617 01/21/18 1624  BP: 134/90 132/79  Pulse: (!) 102 (!) 104  Weight: (!) 322 lb 4.8 oz (146.2 kg)     Fetal Status: Fetal Heart Rate (bpm): 132   Movement: Present     General:  Alert, oriented and cooperative. Patient is in no acute distress.  Skin: Skin is warm and dry. No rash noted.   Cardiovascular: Normal heart rate noted  Respiratory: Normal respiratory effort, no problems with respiration noted  Abdomen: Soft, gravid, appropriate for gestational age.  Pain/Pressure: Present     Pelvic: Cervical exam deferred        Extremities: Normal range of motion.  Edema: None  Mental Status: Normal mood and affect. Normal behavior. Normal judgment and thought content.   Assessment and Plan:  Pregnancy: G2P1001 at [redacted]w[redacted]d  1. Supervision of normal first pregnancy, antepartum Patient requesting to be taken out of work due to being stressed and needing to prepare for delivery Gave her letter for work stating she needed to be allowed to come to MD visits  2. Diet controlled gestational diabetes mellitus (GDM) in third  trimester Poor compliance with OB appointments  Has been checking BG once or twice a day, usually checks after lunch.  FG all > 100, PP wide range from 100-200 Reviewed insulin vs metformin and recommendation to start insulin regimen, she is agreeable. Reviewed need for weekly antenatal testing, she is agreeable. Referral made for insulin teaching.  Unable to do NST in office today, patient affirms normal fetal movement - has BPP/NST scheduled for 10/18, encouraged her to attend  3. H/O gestational diabetes in prior pregnancy, currently pregnant  4. H/O: C-section Reviewed risks/benefits of TOLAC versus RCS in detail. Patient counseled regarding potential vaginal delivery, chance of success, future implications, possible uterine rupture and need for urgent/emergent repeat cesarean. Counseled regarding potential need for repeat c-section for reasons unrelated to first c-section. Counseled regarding scheduled repeat cesarean including risks of bleeding, infection, damage to surrounding tissue, abnormal placentation, implications for future pregnancies. All questions answered.  Patient desires RCS, consent signed today.   5. Multigravida of advanced maternal age in third trimester  6. Obesity during pregnancy   Preterm labor symptoms and general obstetric precautions including but not limited to vaginal bleeding, contractions, leaking of fluid and fetal movement were reviewed in detail with the patient. Please refer to After Visit Summary for other counseling recommendations.  Return in about 1 week (around 01/28/2018) for OB visit (MD), BPP, NST.  Future Appointments  Date Time Provider Department Center  01/23/2018  3:15 PM WH-MFC NST WH-MFC MFC-US  01/23/2018  4:00 PM WH-MFC [name] 3 WH-MFCUS MFC-US  01/30/2018  3:15 PM WH-MFC NST WH-MFC MFC-US  01/30/2018  4:00  PM WH-MFC [name] 3 WH-MFCUS MFC-US  02/05/2018  2:00 PM WOC-EDUCATION WOC-WOCA WOC  02/05/2018  3:15 PM WOC-WOCA NST WOC-WOCA WOC    02/05/2018  4:15 PM Reva Bores, MD WOC-WOCA WOC    Conan Bowens, MD

## 2018-01-21 NOTE — Telephone Encounter (Signed)
Pt stated as I was making her appts that she could not keep missing work to come to her appts. I stated that I would be giving her a work note so that she could make it to her appts. Pt stated that it does not matter I still have to make up that time. She wanted all of her appts made for the same day.

## 2018-01-22 ENCOUNTER — Encounter (HOSPITAL_COMMUNITY): Payer: Self-pay

## 2018-01-23 ENCOUNTER — Ambulatory Visit (HOSPITAL_COMMUNITY)
Admission: RE | Admit: 2018-01-23 | Discharge: 2018-01-23 | Disposition: A | Discharge status: Home / Self Care | Payer: PRIVATE HEALTH INSURANCE | Source: Ambulatory Visit | Attending: Obstetrics & Gynecology | Admitting: Obstetrics & Gynecology

## 2018-01-23 ENCOUNTER — Encounter (HOSPITAL_COMMUNITY): Payer: Self-pay

## 2018-01-23 ENCOUNTER — Ambulatory Visit (HOSPITAL_COMMUNITY)
Admission: RE | Admit: 2018-01-23 | Discharge: 2018-01-23 | Disposition: A | Discharge status: Home / Self Care | Payer: PRIVATE HEALTH INSURANCE | Source: Ambulatory Visit | Attending: Obstetrics and Gynecology | Admitting: Obstetrics and Gynecology

## 2018-01-23 DIAGNOSIS — Z98891 History of uterine scar from previous surgery: Secondary | ICD-10-CM

## 2018-01-23 DIAGNOSIS — O09293 Supervision of pregnancy with other poor reproductive or obstetric history, third trimester: Secondary | ICD-10-CM | POA: Insufficient documentation

## 2018-01-23 DIAGNOSIS — O24410 Gestational diabetes mellitus in pregnancy, diet controlled: Secondary | ICD-10-CM

## 2018-01-23 DIAGNOSIS — Z3400 Encounter for supervision of normal first pregnancy, unspecified trimester: Secondary | ICD-10-CM

## 2018-01-23 DIAGNOSIS — Z3A35 35 weeks gestation of pregnancy: Secondary | ICD-10-CM | POA: Insufficient documentation

## 2018-01-23 DIAGNOSIS — O09523 Supervision of elderly multigravida, third trimester: Secondary | ICD-10-CM | POA: Diagnosis not present

## 2018-01-23 DIAGNOSIS — R7303 Prediabetes: Secondary | ICD-10-CM

## 2018-01-23 DIAGNOSIS — O99210 Obesity complicating pregnancy, unspecified trimester: Secondary | ICD-10-CM

## 2018-01-23 DIAGNOSIS — Z8632 Personal history of gestational diabetes: Secondary | ICD-10-CM

## 2018-01-23 DIAGNOSIS — O09299 Supervision of pregnancy with other poor reproductive or obstetric history, unspecified trimester: Secondary | ICD-10-CM

## 2018-01-23 NOTE — Procedures (Signed)
Miranda Ruiz 13-Nov-1980 [redacted]w[redacted]d  Fetus A Non-Stress Test Interpretation for 01/23/18  Indication: Diabetes  Fetal Heart Rate A Mode: External Baseline Rate (A): 125 bpm Variability: Moderate Accelerations: 15 x 15 Decelerations: None Multiple birth?: No  Uterine Activity Mode: Palpation, Toco Contraction Frequency (min): none Resting Tone Palpated: Relaxed Resting Time: Adequate  Interpretation (Fetal Testing) Nonstress Test Interpretation: Reactive Comments: Reviewed tracing with Dr. Perry Mount

## 2018-01-26 ENCOUNTER — Other Ambulatory Visit: Payer: Self-pay | Admitting: Obstetrics and Gynecology

## 2018-01-26 MED ORDER — INSULIN NPH (HUMAN) (ISOPHANE) 100 UNIT/ML ~~LOC~~ SUSP: 10.0000 [IU] | Freq: Every day | SUBCUTANEOUS | 3 refills | Status: DC

## 2018-01-26 NOTE — Progress Notes (Signed)
NPH ordered, patient to see diabetic counselor for insulin teaching.

## 2018-01-27 ENCOUNTER — Telehealth: Payer: Self-pay | Admitting: General Practice

## 2018-01-27 NOTE — Telephone Encounter (Signed)
-----   Message from Conan Bowens, MD sent at 01/26/2018  7:52 PM EDT ----- Patient needs to see Pincus Large for insulin teaching. I have sent script for insulin to pharmacy for her to pick up.

## 2018-01-27 NOTE — Telephone Encounter (Signed)
Per chart review, patient has appt for diabetes ed 10/31- will try to contact patient for sooner appt. Miranda Ruiz could see patient today at 12pm, otherwise patient will have to wait until scheduled appt on 10/31. Called patient, no answer- left message to give [name] a call back regarding an appt. Will also send mychart message.

## 2018-01-30 ENCOUNTER — Other Ambulatory Visit (HOSPITAL_COMMUNITY): Payer: Self-pay | Admitting: Maternal and Fetal Medicine

## 2018-01-30 ENCOUNTER — Ambulatory Visit (HOSPITAL_COMMUNITY)
Admission: RE | Admit: 2018-01-30 | Discharge: 2018-01-30 | Disposition: A | Discharge status: Home / Self Care | Payer: PRIVATE HEALTH INSURANCE | Source: Ambulatory Visit | Attending: Obstetrics and Gynecology | Admitting: Obstetrics and Gynecology

## 2018-01-30 ENCOUNTER — Ambulatory Visit (HOSPITAL_COMMUNITY)
Admission: RE | Admit: 2018-01-30 | Discharge: 2018-01-30 | Disposition: A | Discharge status: Home / Self Care | Payer: PRIVATE HEALTH INSURANCE | Source: Ambulatory Visit | Attending: Obstetrics & Gynecology | Admitting: Obstetrics & Gynecology

## 2018-01-30 ENCOUNTER — Encounter (HOSPITAL_COMMUNITY): Payer: Self-pay

## 2018-01-30 DIAGNOSIS — O09293 Supervision of pregnancy with other poor reproductive or obstetric history, third trimester: Secondary | ICD-10-CM | POA: Diagnosis present

## 2018-01-30 DIAGNOSIS — Z8632 Personal history of gestational diabetes: Secondary | ICD-10-CM

## 2018-01-30 DIAGNOSIS — Z3A36 36 weeks gestation of pregnancy: Secondary | ICD-10-CM | POA: Insufficient documentation

## 2018-01-30 DIAGNOSIS — O24419 Gestational diabetes mellitus in pregnancy, unspecified control: Secondary | ICD-10-CM

## 2018-01-30 DIAGNOSIS — O24410 Gestational diabetes mellitus in pregnancy, diet controlled: Secondary | ICD-10-CM | POA: Diagnosis not present

## 2018-01-30 DIAGNOSIS — O09523 Supervision of elderly multigravida, third trimester: Secondary | ICD-10-CM | POA: Diagnosis not present

## 2018-01-30 DIAGNOSIS — Z3400 Encounter for supervision of normal first pregnancy, unspecified trimester: Secondary | ICD-10-CM

## 2018-01-30 DIAGNOSIS — Z98891 History of uterine scar from previous surgery: Secondary | ICD-10-CM

## 2018-01-30 DIAGNOSIS — R7303 Prediabetes: Secondary | ICD-10-CM

## 2018-01-30 DIAGNOSIS — O99210 Obesity complicating pregnancy, unspecified trimester: Secondary | ICD-10-CM

## 2018-01-30 DIAGNOSIS — O09299 Supervision of pregnancy with other poor reproductive or obstetric history, unspecified trimester: Secondary | ICD-10-CM

## 2018-01-30 NOTE — Procedures (Signed)
Miranda Ruiz 04-Aug-1980 [redacted]w[redacted]d  Fetus A Non-Stress Test Interpretation for 01/30/18  Indication: Advanced Maternal Age >40 years  Fetal Heart Rate A Mode: External Baseline Rate (A): 130 bpm Variability: Moderate Accelerations: 15 x 15 Decelerations: None Multiple birth?: No  Uterine Activity Mode: Palpation, Toco Contraction Frequency (min): U/I Contraction Duration (sec): 20-30 Contraction Quality: Mild Resting Tone Palpated: Relaxed Resting Time: Adequate  Interpretation (Fetal Testing) Nonstress Test Interpretation: Reactive Comments: Reviewed tracing with Dr. Grace Bushy

## 2018-02-02 ENCOUNTER — Encounter: Payer: Self-pay | Admitting: *Deleted

## 2018-02-02 ENCOUNTER — Encounter (HOSPITAL_COMMUNITY): Payer: Self-pay

## 2018-02-02 ENCOUNTER — Other Ambulatory Visit: Payer: PRIVATE HEALTH INSURANCE

## 2018-02-02 ENCOUNTER — Encounter: Payer: PRIVATE HEALTH INSURANCE | Admitting: Family Medicine

## 2018-02-03 ENCOUNTER — Other Ambulatory Visit: Payer: Self-pay | Admitting: Family Medicine

## 2018-02-05 ENCOUNTER — Encounter: Payer: Self-pay | Admitting: Family Medicine

## 2018-02-05 ENCOUNTER — Encounter: Payer: PRIVATE HEALTH INSURANCE | Attending: Family Medicine | Admitting: *Deleted

## 2018-02-05 ENCOUNTER — Ambulatory Visit (INDEPENDENT_AMBULATORY_CARE_PROVIDER_SITE_OTHER): Payer: PRIVATE HEALTH INSURANCE | Admitting: Family Medicine

## 2018-02-05 ENCOUNTER — Ambulatory Visit: Payer: Self-pay

## 2018-02-05 ENCOUNTER — Ambulatory Visit: Payer: PRIVATE HEALTH INSURANCE | Admitting: *Deleted

## 2018-02-05 ENCOUNTER — Other Ambulatory Visit (HOSPITAL_COMMUNITY)
Admission: RE | Admit: 2018-02-05 | Discharge: 2018-02-05 | Disposition: A | Discharge status: Home / Self Care | Payer: PRIVATE HEALTH INSURANCE | Source: Ambulatory Visit | Attending: Family Medicine | Admitting: Family Medicine

## 2018-02-05 ENCOUNTER — Ambulatory Visit (INDEPENDENT_AMBULATORY_CARE_PROVIDER_SITE_OTHER): Payer: PRIVATE HEALTH INSURANCE | Admitting: *Deleted

## 2018-02-05 VITALS — BP 131/84 | HR 97 | Wt 321.6 lb

## 2018-02-05 DIAGNOSIS — Z713 Dietary counseling and surveillance: Secondary | ICD-10-CM | POA: Insufficient documentation

## 2018-02-05 DIAGNOSIS — O24410 Gestational diabetes mellitus in pregnancy, diet controlled: Secondary | ICD-10-CM | POA: Diagnosis not present

## 2018-02-05 DIAGNOSIS — Z98891 History of uterine scar from previous surgery: Secondary | ICD-10-CM

## 2018-02-05 DIAGNOSIS — Z3400 Encounter for supervision of normal first pregnancy, unspecified trimester: Secondary | ICD-10-CM | POA: Insufficient documentation

## 2018-02-05 DIAGNOSIS — O24414 Gestational diabetes mellitus in pregnancy, insulin controlled: Secondary | ICD-10-CM

## 2018-02-05 DIAGNOSIS — O24419 Gestational diabetes mellitus in pregnancy, unspecified control: Secondary | ICD-10-CM

## 2018-02-05 DIAGNOSIS — Z3A00 Weeks of gestation of pregnancy not specified: Secondary | ICD-10-CM | POA: Diagnosis not present

## 2018-02-05 MED ORDER — INSULIN SYRINGES (DISPOSABLE) U-100 0.3 ML MISC: 1.0000 | Freq: Two times a day (BID) | 0 refills | Status: DC

## 2018-02-05 MED ORDER — INSULIN NPH (HUMAN) (ISOPHANE) 100 UNIT/ML ~~LOC~~ SUSP: 10.0000 [IU] | Freq: Every day | SUBCUTANEOUS | 3 refills | Status: DC

## 2018-02-05 MED ORDER — INSULIN NPH (HUMAN) (ISOPHANE) 100 UNIT/ML ~~LOC~~ SUSP: 10.0000 [IU] | Freq: Two times a day (BID) | SUBCUTANEOUS | 3 refills | Status: DC

## 2018-02-05 NOTE — Progress Notes (Signed)
Pt has not started taking insulin.  She checks her blood sugar 0-2 times daily. She is not always able to eat and therefore does not check CBG.

## 2018-02-05 NOTE — Progress Notes (Signed)
   PRENATAL VISIT NOTE  Subjective:  Miranda Ruiz is a 37 y.o. G2P1001 at [redacted]w[redacted]d being seen today for ongoing prenatal care.  She is currently monitored for the following issues for this high-risk pregnancy and has Supervision of normal first pregnancy, antepartum; Obesity during pregnancy; H/O: C-section; H/O gestational diabetes in prior pregnancy, currently pregnant; Advanced maternal age in multigravida; Prediabetes; and Gestational diabetes on their problem list.  Patient reports no complaints.  Contractions: Irregular. Vag. Bleeding: None.  Movement: Present. Denies leaking of fluid.   The following portions of the patient's history were reviewed and updated as appropriate: allergies, current medications, past family history, past medical history, past social history, past surgical history and problem list. Problem list updated.  Objective:   Vitals:   02/05/18 1420  BP: 131/84  Pulse: 97  Weight: (!) 321 lb 9.6 oz (145.9 kg)    Fetal Status: Fetal Heart Rate (bpm): NST   Movement: Present     General:  Alert, oriented and cooperative. Patient is in no acute distress.  Skin: Skin is warm and dry. No rash noted.   Cardiovascular: Normal heart rate noted  Respiratory: Normal respiratory effort, no problems with respiration noted  Abdomen: Soft, gravid, appropriate for gestational age.  Pain/Pressure: Present     Pelvic: Cervical exam deferred        Extremities: Normal range of motion.  Edema: Mild pitting, slight indentation  Mental Status: Normal mood and affect. Normal behavior. Normal judgment and thought content.  NST:  Baseline: 130 bpm, Variability: Good {> 6 bpm), Accelerations: Reactive and Decelerations: Absent   Assessment and Plan:  Pregnancy: G2P1001 at [redacted]w[redacted]d  1. Supervision of normal first pregnancy, antepartum Cultures today - Culture, beta strep (group b only) - GC/Chlamydia probe amp ([place])not at [facility]  2. Gestational diabetes mellitus (GDM),  antepartum, gestational diabetes method of control unspecified Fasting CBGs are out of range--to add 10 u NPH--has not picked up rx yet, new pharmacy picked Teaching done today - insulin NPH Human (HUMULIN N,NOVOLIN N) 100 UNIT/ML injection; Inject 0.1 mLs (10 Units total) into the skin 2 (two) times daily at 8 am and 10 pm.  Dispense: 10 mL; Refill: 3 - Insulin Syringes, Disposable, U-100 0.3 ML MISC; 1 each by Does not apply route 2 (two) times daily at 8 am and 10 pm.  Dispense: 100 each; Refill: 0  3. H/O: C-section For RLTCS--scheduled  Preterm labor symptoms and general obstetric precautions including but not limited to vaginal bleeding, contractions, leaking of fluid and fetal movement were reviewed in detail with the patient. Please refer to After Visit Summary for other counseling recommendations.  Return in about 1 week (around 02/12/2018) for NST/BPP and HOB - need urine.  Future Appointments  Date Time Provider Department Center  02/12/2018  8:15 AM WOC-WOCA NST WOC-WOCA WOC  02/12/2018  9:15 AM Allie Bossier, MD WOC-WOCA WOC  02/16/2018 10:00 AM WH-SDCW PAT 5 WH-SDCW None    Reva Bores, MD

## 2018-02-05 NOTE — Patient Instructions (Signed)

## 2018-02-05 NOTE — Progress Notes (Signed)
Insulin Instruction  Patient was seen on 02/05/2018 for insulin instruction.  The following learning objectives were met by the patient during this visit:   Insulin Action of NPH insulins  Reviewed syringe & vial VS pen including # units per syringe,    length of needles,   Hygiene and storage  Drawing up single and mixed doses if using vials   Single dose      Rotation of Sites  Hypoglycemia- symptoms, causes , treatment choices  Record keeping and MD follow up   Patient demonstrated understanding of insulin administration by return demonstration. She states she has taken insulin before with her last pregnancy  Patient received the following handouts:  Insulin Instruction Handout                                        Patient to start on insulin as Rx'd by MD  Patient will be seen for follow-up as needed.

## 2018-02-06 LAB — GC/CHLAMYDIA PROBE AMP (~~LOC~~) NOT AT ARMC
Chlamydia: NEGATIVE
NEISSERIA GONORRHEA: NEGATIVE

## 2018-02-08 LAB — CULTURE, BETA STREP (GROUP B ONLY): STREP GP B CULTURE: POSITIVE — AB

## 2018-02-09 ENCOUNTER — Encounter: Payer: Self-pay | Admitting: Family Medicine

## 2018-02-09 DIAGNOSIS — O99820 Streptococcus B carrier state complicating pregnancy: Secondary | ICD-10-CM | POA: Insufficient documentation

## 2018-02-10 ENCOUNTER — Encounter: Payer: Self-pay | Admitting: Family Medicine

## 2018-02-11 ENCOUNTER — Telehealth: Payer: Self-pay | Admitting: General Practice

## 2018-02-11 NOTE — Telephone Encounter (Signed)
Patient called into front office stating she had a few questions about a c-section. Patient wants to know if you can drive afterwards and how long you are usually in the hospital. Told patient we usually recommend no driving for a couple weeks as you are generally on pain medication during that time. Discussed average hospital stay after a c-section is 3 days. Patient verbalized understanding and had no questions.

## 2018-02-12 ENCOUNTER — Encounter: Payer: PRIVATE HEALTH INSURANCE | Admitting: Obstetrics & Gynecology

## 2018-02-12 ENCOUNTER — Other Ambulatory Visit: Payer: PRIVATE HEALTH INSURANCE

## 2018-02-13 ENCOUNTER — Other Ambulatory Visit: Payer: Self-pay | Admitting: Family Medicine

## 2018-02-14 NOTE — Anesthesia Preprocedure Evaluation (Addendum)
Anesthesia Evaluation  Patient identified by MRN, date of birth, ID band Patient awake    Reviewed: Allergy & Precautions, NPO status , Patient's Chart, lab work & pertinent test results  History of Anesthesia Complications Negative for: history of anesthetic complications  Airway Mallampati: III  TM Distance: >3 FB Neck ROM: Full    Dental no notable dental hx. (+) Teeth Intact, Dental Advisory Given   Pulmonary former smoker,    Pulmonary exam normal breath sounds clear to auscultation       Cardiovascular negative cardio ROS Normal cardiovascular exam Rhythm:Regular Rate:Normal     Neuro/Psych negative neurological ROS  negative psych ROS   GI/Hepatic negative GI ROS, Neg liver ROS,   Endo/Other  diabetes, Gestational, Insulin DependentMorbid obesity  Renal/GU negative Renal ROS  negative genitourinary   Musculoskeletal negative musculoskeletal ROS (+)   Abdominal (+) + obese,   Peds negative pediatric ROS (+)  Hematology negative hematology ROS (+)   Anesthesia Other Findings Day of surgery medications reviewed with the patient.  Reproductive/Obstetrics (+) Pregnancy Hx of C/S x1                           Anesthesia Physical Anesthesia Plan  ASA: III  Anesthesia Plan: Spinal   Post-op Pain Management:    Induction:   PONV Risk Score and Plan: 2 and Treatment may vary due to age or medical condition, Ondansetron and Dexamethasone  Airway Management Planned: Natural Airway  Additional Equipment:   Intra-op Plan:   Post-operative Plan:   Informed Consent: I have reviewed the patients History and Physical, chart, labs and discussed the procedure including the risks, benefits and alternatives for the proposed anesthesia with the patient or authorized representative who has indicated his/her understanding and acceptance.     Plan Discussed with: CRNA  Anesthesia Plan  Comments:        Anesthesia Quick Evaluation

## 2018-02-16 ENCOUNTER — Encounter (HOSPITAL_COMMUNITY)
Admission: RE | Admit: 2018-02-16 | Discharge: 2018-02-16 | Disposition: A | Discharge status: Home / Self Care | Payer: PRIVATE HEALTH INSURANCE | Source: Ambulatory Visit | Attending: Family Medicine | Admitting: Family Medicine

## 2018-02-16 LAB — CBC
HCT: 36.1 % (ref 36.0–46.0)
Hemoglobin: 12.1 g/dL (ref 12.0–15.0)
MCH: 29.5 pg (ref 26.0–34.0)
MCHC: 33.5 g/dL (ref 30.0–36.0)
MCV: 88 fL (ref 80.0–100.0)
NRBC: 0 % (ref 0.0–0.2)
PLATELETS: 232 10*3/uL (ref 150–400)
RBC: 4.1 MIL/uL (ref 3.87–5.11)
RDW: 13.4 % (ref 11.5–15.5)
WBC: 4.4 10*3/uL (ref 4.0–10.5)

## 2018-02-16 LAB — TYPE AND SCREEN
ABO/RH(D): O POS
Antibody Screen: NEGATIVE

## 2018-02-16 LAB — RAPID HIV SCREEN (HIV 1/2 AB+AG)
HIV 1/2 Antibodies: NONREACTIVE
HIV-1 P24 ANTIGEN - HIV24: NONREACTIVE

## 2018-02-16 LAB — ABO/RH: ABO/RH(D): O POS

## 2018-02-16 NOTE — Patient Instructions (Signed)
Miranda Ruiz  02/16/2018   Your procedure is scheduled on:  02/17/2018  Enter through the Main Entrance of [facility] at 1015 AM.  Pick up the phone at the desk and dial [phone]  Call this number if you have problems the morning of surgery:[phone]  Remember:   Do not eat food:(After Midnight) Desps de medianoche.  Do not drink clear liquids: (After Midnight) Desps de medianoche.  Take these medicines the morning of surgery with A SIP OF WATER: inject 5 units NPH insulin at 10pm tonight instead of 10 units.  NO INSULIN THE MORNING OF SURGERY   Do not wear jewelry, make-up or nail polish.  Do not wear lotions, powders, or perfumes. Do not wear deodorant.  Do not shave 48 hours prior to surgery.  Do not bring valuables to the hospital.  [facility] is not   responsible for any belongings or valuables brought to the hospital.  Contacts, dentures or bridgework may not be worn into surgery.  Leave suitcase in the car. After surgery it may be brought to your room.  For patients admitted to the hospital, checkout time is 11:00 AM the day of              discharge.    N/A   Please read over the following fact sheets that you were given:   Surgical Site Infection Prevention

## 2018-02-17 ENCOUNTER — Inpatient Hospital Stay (HOSPITAL_COMMUNITY): Payer: PRIVATE HEALTH INSURANCE | Admitting: Anesthesiology

## 2018-02-17 ENCOUNTER — Inpatient Hospital Stay (HOSPITAL_COMMUNITY)
Admission: RE | Admit: 2018-02-17 | Discharge: 2018-02-19 | DRG: 788 | Disposition: A | Discharge status: Home / Self Care | Payer: PRIVATE HEALTH INSURANCE | Attending: Family Medicine | Admitting: Family Medicine

## 2018-02-17 ENCOUNTER — Encounter (HOSPITAL_COMMUNITY)
Admission: RE | Disposition: A | Discharge status: Home / Self Care | Payer: Self-pay | Source: Home / Self Care | Attending: Family Medicine

## 2018-02-17 ENCOUNTER — Encounter (HOSPITAL_COMMUNITY): Payer: Self-pay

## 2018-02-17 DIAGNOSIS — O24419 Gestational diabetes mellitus in pregnancy, unspecified control: Secondary | ICD-10-CM

## 2018-02-17 DIAGNOSIS — O99824 Streptococcus B carrier state complicating childbirth: Secondary | ICD-10-CM | POA: Diagnosis present

## 2018-02-17 DIAGNOSIS — O9902 Anemia complicating childbirth: Secondary | ICD-10-CM | POA: Diagnosis present

## 2018-02-17 DIAGNOSIS — O09529 Supervision of elderly multigravida, unspecified trimester: Secondary | ICD-10-CM

## 2018-02-17 DIAGNOSIS — Z3A39 39 weeks gestation of pregnancy: Secondary | ICD-10-CM

## 2018-02-17 DIAGNOSIS — O34211 Maternal care for low transverse scar from previous cesarean delivery: Principal | ICD-10-CM | POA: Diagnosis present

## 2018-02-17 DIAGNOSIS — O99214 Obesity complicating childbirth: Secondary | ICD-10-CM | POA: Diagnosis present

## 2018-02-17 DIAGNOSIS — D649 Anemia, unspecified: Secondary | ICD-10-CM | POA: Diagnosis present

## 2018-02-17 DIAGNOSIS — O34219 Maternal care for unspecified type scar from previous cesarean delivery: Secondary | ICD-10-CM

## 2018-02-17 DIAGNOSIS — Z8632 Personal history of gestational diabetes: Secondary | ICD-10-CM

## 2018-02-17 DIAGNOSIS — R7303 Prediabetes: Secondary | ICD-10-CM

## 2018-02-17 DIAGNOSIS — Z87891 Personal history of nicotine dependence: Secondary | ICD-10-CM

## 2018-02-17 DIAGNOSIS — O09523 Supervision of elderly multigravida, third trimester: Secondary | ICD-10-CM

## 2018-02-17 DIAGNOSIS — O09299 Supervision of pregnancy with other poor reproductive or obstetric history, unspecified trimester: Secondary | ICD-10-CM

## 2018-02-17 DIAGNOSIS — O24424 Gestational diabetes mellitus in childbirth, insulin controlled: Secondary | ICD-10-CM | POA: Diagnosis present

## 2018-02-17 DIAGNOSIS — O99210 Obesity complicating pregnancy, unspecified trimester: Secondary | ICD-10-CM | POA: Diagnosis present

## 2018-02-17 DIAGNOSIS — Z98891 History of uterine scar from previous surgery: Secondary | ICD-10-CM

## 2018-02-17 DIAGNOSIS — O99820 Streptococcus B carrier state complicating pregnancy: Secondary | ICD-10-CM

## 2018-02-17 DIAGNOSIS — Z3400 Encounter for supervision of normal first pregnancy, unspecified trimester: Secondary | ICD-10-CM

## 2018-02-17 LAB — GLUCOSE, CAPILLARY
GLUCOSE-CAPILLARY: 90 mg/dL (ref 70–99)
Glucose-Capillary: 75 mg/dL (ref 70–99)

## 2018-02-17 LAB — RPR: RPR: NONREACTIVE

## 2018-02-17 SURGERY — Surgical Case
Anesthesia: Spinal

## 2018-02-17 MED ORDER — ENOXAPARIN SODIUM 80 MG/0.8ML ~~LOC~~ SOLN
70.0000 mg | SUBCUTANEOUS | Status: DC
  Administered 2018-02-18: 70 mg via SUBCUTANEOUS
  Filled 2018-02-17 (×3): qty 0.8

## 2018-02-17 MED ORDER — NALBUPHINE HCL 10 MG/ML IJ SOLN: 5.0000 mg | Freq: Once | INTRAMUSCULAR | Status: DC | PRN

## 2018-02-17 MED ORDER — OXYTOCIN 10 UNIT/ML IJ SOLN
INTRAVENOUS | Status: DC | PRN
  Administered 2018-02-17: 40 [IU] via INTRAVENOUS

## 2018-02-17 MED ORDER — PHENYLEPHRINE 40 MCG/ML (10ML) SYRINGE FOR IV PUSH (FOR BLOOD PRESSURE SUPPORT)
PREFILLED_SYRINGE | INTRAVENOUS | Status: DC | PRN
  Administered 2018-02-17 (×2): 80 ug via INTRAVENOUS
  Administered 2018-02-17 (×2): 40 ug via INTRAVENOUS

## 2018-02-17 MED ORDER — LACTATED RINGERS IV SOLN
125.0000 mL/h | INTRAVENOUS | Status: DC
  Administered 2018-02-17: 13:00:00 via INTRAVENOUS
  Administered 2018-02-17: 125 mL/h via INTRAVENOUS

## 2018-02-17 MED ORDER — KETOROLAC TROMETHAMINE 30 MG/ML IJ SOLN: 30.0000 mg | Freq: Once | INTRAMUSCULAR | Status: DC | PRN

## 2018-02-17 MED ORDER — MORPHINE SULFATE (PF) 0.5 MG/ML IJ SOLN
INTRAMUSCULAR | Status: DC | PRN
  Administered 2018-02-17: .15 mg via INTRATHECAL

## 2018-02-17 MED ORDER — NALOXONE HCL 4 MG/10ML IJ SOLN: 1.0000 ug/kg/h | INTRAVENOUS | Status: DC | PRN

## 2018-02-17 MED ORDER — FENTANYL CITRATE (PF) 100 MCG/2ML IJ SOLN: 25.0000 ug | INTRAMUSCULAR | Status: DC | PRN

## 2018-02-17 MED ORDER — ONDANSETRON HCL 4 MG/2ML IJ SOLN
INTRAMUSCULAR | Status: DC | PRN
  Administered 2018-02-17: 4 mg via INTRAVENOUS

## 2018-02-17 MED ORDER — SIMETHICONE 80 MG PO CHEW
80.0000 mg | CHEWABLE_TABLET | Freq: Three times a day (TID) | ORAL | Status: DC
  Administered 2018-02-17 – 2018-02-19 (×5): 80 mg via ORAL
  Filled 2018-02-17 (×5): qty 1

## 2018-02-17 MED ORDER — KETOROLAC TROMETHAMINE 30 MG/ML IJ SOLN: 30.0000 mg | Freq: Four times a day (QID) | INTRAMUSCULAR | Status: AC | PRN

## 2018-02-17 MED ORDER — PHENYLEPHRINE 8 MG IN D5W 100 ML (0.08MG/ML) PREMIX OPTIME
INJECTION | INTRAVENOUS | Status: DC | PRN
  Administered 2018-02-17: 60 ug/min via INTRAVENOUS
  Administered 2018-02-17: 80 ug/min via INTRAVENOUS

## 2018-02-17 MED ORDER — ERYTHROMYCIN 5 MG/GM OP OINT
TOPICAL_OINTMENT | OPHTHALMIC | Status: AC
  Filled 2018-02-17: qty 1

## 2018-02-17 MED ORDER — OXYCODONE HCL 5 MG PO TABS: 10.0000 mg | ORAL_TABLET | ORAL | Status: DC | PRN

## 2018-02-17 MED ORDER — NALBUPHINE HCL 10 MG/ML IJ SOLN: 5.0000 mg | INTRAMUSCULAR | Status: DC | PRN

## 2018-02-17 MED ORDER — FENTANYL CITRATE (PF) 100 MCG/2ML IJ SOLN
INTRAMUSCULAR | Status: DC | PRN
  Administered 2018-02-17: 15 ug via INTRATHECAL

## 2018-02-17 MED ORDER — ACETAMINOPHEN 325 MG PO TABS: 650.0000 mg | ORAL_TABLET | ORAL | Status: DC | PRN

## 2018-02-17 MED ORDER — SODIUM CHLORIDE 0.9% FLUSH: 3.0000 mL | INTRAVENOUS | Status: DC | PRN

## 2018-02-17 MED ORDER — PRENATAL MULTIVITAMIN CH
1.0000 | ORAL_TABLET | Freq: Every day | ORAL | Status: DC
  Administered 2018-02-18 – 2018-02-19 (×2): 1 via ORAL
  Filled 2018-02-17 (×2): qty 1

## 2018-02-17 MED ORDER — COCONUT OIL OIL: 1.0000 "application " | TOPICAL_OIL | Status: DC | PRN

## 2018-02-17 MED ORDER — FENTANYL CITRATE (PF) 100 MCG/2ML IJ SOLN
INTRAMUSCULAR | Status: AC
  Filled 2018-02-17: qty 2

## 2018-02-17 MED ORDER — DIPHENHYDRAMINE HCL 25 MG PO CAPS: 25.0000 mg | ORAL_CAPSULE | ORAL | Status: DC | PRN

## 2018-02-17 MED ORDER — MENTHOL 3 MG MT LOZG: 1.0000 | LOZENGE | OROMUCOSAL | Status: DC | PRN

## 2018-02-17 MED ORDER — SENNOSIDES-DOCUSATE SODIUM 8.6-50 MG PO TABS
2.0000 | ORAL_TABLET | ORAL | Status: DC
  Administered 2018-02-18 (×2): 2 via ORAL
  Filled 2018-02-17 (×2): qty 2

## 2018-02-17 MED ORDER — ONDANSETRON HCL 4 MG/2ML IJ SOLN: 4.0000 mg | Freq: Three times a day (TID) | INTRAMUSCULAR | Status: DC | PRN

## 2018-02-17 MED ORDER — SOD CITRATE-CITRIC ACID 500-334 MG/5ML PO SOLN
30.0000 mL | Freq: Once | ORAL | Status: AC
  Administered 2018-02-17: 30 mL via ORAL

## 2018-02-17 MED ORDER — DEXAMETHASONE SODIUM PHOSPHATE 4 MG/ML IJ SOLN
INTRAMUSCULAR | Status: AC
  Filled 2018-02-17: qty 1

## 2018-02-17 MED ORDER — SIMETHICONE 80 MG PO CHEW
80.0000 mg | CHEWABLE_TABLET | ORAL | Status: DC
  Administered 2018-02-18 (×2): 80 mg via ORAL
  Filled 2018-02-17 (×2): qty 1

## 2018-02-17 MED ORDER — MORPHINE SULFATE (PF) 0.5 MG/ML IJ SOLN
INTRAMUSCULAR | Status: AC
  Filled 2018-02-17: qty 10

## 2018-02-17 MED ORDER — DIBUCAINE 1 % RE OINT: 1.0000 "application " | TOPICAL_OINTMENT | RECTAL | Status: DC | PRN

## 2018-02-17 MED ORDER — PROMETHAZINE HCL 25 MG/ML IJ SOLN: 6.2500 mg | INTRAMUSCULAR | Status: DC | PRN

## 2018-02-17 MED ORDER — OXYTOCIN 10 UNIT/ML IJ SOLN
INTRAMUSCULAR | Status: AC
  Filled 2018-02-17: qty 4

## 2018-02-17 MED ORDER — SODIUM CHLORIDE 0.9 % IR SOLN
Status: DC | PRN
  Administered 2018-02-17: 1

## 2018-02-17 MED ORDER — IBUPROFEN 600 MG PO TABS
600.0000 mg | ORAL_TABLET | Freq: Four times a day (QID) | ORAL | Status: DC
  Administered 2018-02-17 – 2018-02-19 (×8): 600 mg via ORAL
  Filled 2018-02-17 (×8): qty 1

## 2018-02-17 MED ORDER — DEXTROSE 5 % IV SOLN
3.0000 g | INTRAVENOUS | Status: AC
  Administered 2018-02-17: 3 g via INTRAVENOUS
  Filled 2018-02-17: qty 3000

## 2018-02-17 MED ORDER — SOD CITRATE-CITRIC ACID 500-334 MG/5ML PO SOLN
ORAL | Status: AC
  Filled 2018-02-17: qty 15

## 2018-02-17 MED ORDER — ZOLPIDEM TARTRATE 5 MG PO TABS: 5.0000 mg | ORAL_TABLET | Freq: Every evening | ORAL | Status: DC | PRN

## 2018-02-17 MED ORDER — TETANUS-DIPHTH-ACELL PERTUSSIS 5-2.5-18.5 LF-MCG/0.5 IM SUSP: 0.5000 mL | Freq: Once | INTRAMUSCULAR | Status: DC

## 2018-02-17 MED ORDER — ACETAMINOPHEN 500 MG PO TABS
1000.0000 mg | ORAL_TABLET | Freq: Four times a day (QID) | ORAL | Status: AC
  Administered 2018-02-17 – 2018-02-18 (×4): 1000 mg via ORAL
  Filled 2018-02-17 (×4): qty 2

## 2018-02-17 MED ORDER — VITAMIN K1 1 MG/0.5ML IJ SOLN
INTRAMUSCULAR | Status: AC
  Filled 2018-02-17: qty 0.5

## 2018-02-17 MED ORDER — OXYCODONE HCL 5 MG PO TABS
5.0000 mg | ORAL_TABLET | ORAL | Status: DC | PRN
  Administered 2018-02-18 (×3): 5 mg via ORAL
  Filled 2018-02-17 (×3): qty 1

## 2018-02-17 MED ORDER — ONDANSETRON HCL 4 MG/2ML IJ SOLN
INTRAMUSCULAR | Status: AC
  Filled 2018-02-17: qty 2

## 2018-02-17 MED ORDER — DIPHENHYDRAMINE HCL 25 MG PO CAPS: 25.0000 mg | ORAL_CAPSULE | Freq: Four times a day (QID) | ORAL | Status: DC | PRN

## 2018-02-17 MED ORDER — SIMETHICONE 80 MG PO CHEW: 80.0000 mg | CHEWABLE_TABLET | ORAL | Status: DC | PRN

## 2018-02-17 MED ORDER — MEASLES, MUMPS & RUBELLA VAC IJ SOLR: 0.5000 mL | Freq: Once | INTRAMUSCULAR | Status: DC

## 2018-02-17 MED ORDER — DEXAMETHASONE SODIUM PHOSPHATE 4 MG/ML IJ SOLN
INTRAMUSCULAR | Status: DC | PRN
  Administered 2018-02-17: 4 mg via INTRAVENOUS

## 2018-02-17 MED ORDER — NALOXONE HCL 0.4 MG/ML IJ SOLN: 0.4000 mg | INTRAMUSCULAR | Status: DC | PRN

## 2018-02-17 MED ORDER — DIPHENHYDRAMINE HCL 50 MG/ML IJ SOLN: 12.5000 mg | INTRAMUSCULAR | Status: DC | PRN

## 2018-02-17 MED ORDER — WITCH HAZEL-GLYCERIN EX PADS: 1.0000 "application " | MEDICATED_PAD | CUTANEOUS | Status: DC | PRN

## 2018-02-17 MED ORDER — BUPIVACAINE IN DEXTROSE 0.75-8.25 % IT SOLN
INTRATHECAL | Status: DC | PRN
  Administered 2018-02-17: 1.6 mL via INTRATHECAL

## 2018-02-17 MED ORDER — LACTATED RINGERS IV SOLN
INTRAVENOUS | Status: DC
  Administered 2018-02-17: 23:00:00 via INTRAVENOUS

## 2018-02-17 MED ORDER — OXYTOCIN 40 UNITS IN LACTATED RINGERS INFUSION - SIMPLE MED
2.5000 [IU]/h | INTRAVENOUS | Status: AC
  Administered 2018-02-17: 2.5 [IU]/h via INTRAVENOUS

## 2018-02-17 MED ORDER — LACTATED RINGERS IV SOLN
INTRAVENOUS | Status: DC | PRN
  Administered 2018-02-17: 14:00:00 via INTRAVENOUS

## 2018-02-17 SURGICAL SUPPLY — 38 items
BENZOIN TINCTURE PRP APPL 2/3 (GAUZE/BANDAGES/DRESSINGS) ×3 IMPLANT
CHLORAPREP W/TINT 26ML (MISCELLANEOUS) ×3 IMPLANT
CLAMP CORD UMBIL (MISCELLANEOUS) IMPLANT
CLOSURE WOUND 1/2 X4 (GAUZE/BANDAGES/DRESSINGS) ×1
CLOTH BEACON ORANGE TIMEOUT ST (SAFETY) ×3 IMPLANT
DRESSING PREVENA PLUS CUSTOM (GAUZE/BANDAGES/DRESSINGS) ×1 IMPLANT
DRSG OPSITE POSTOP 4X10 (GAUZE/BANDAGES/DRESSINGS) ×3 IMPLANT
DRSG PREVENA PLUS CUSTOM (GAUZE/BANDAGES/DRESSINGS) ×3
ELECT REM PT RETURN 9FT ADLT (ELECTROSURGICAL) ×3
ELECTRODE REM PT RTRN 9FT ADLT (ELECTROSURGICAL) ×1 IMPLANT
EXTENDER TRAXI PANNICULUS (MISCELLANEOUS) ×1 IMPLANT
EXTRACTOR VACUUM M CUP 4 TUBE (SUCTIONS) IMPLANT
EXTRACTOR VACUUM M CUP 4' TUBE (SUCTIONS)
GLOVE BIOGEL PI IND STRL 7.0 (GLOVE) ×2 IMPLANT
GLOVE BIOGEL PI IND STRL 7.5 (GLOVE) ×2 IMPLANT
GLOVE BIOGEL PI INDICATOR 7.0 (GLOVE) ×4
GLOVE BIOGEL PI INDICATOR 7.5 (GLOVE) ×4
GLOVE ECLIPSE 7.5 STRL STRAW (GLOVE) ×3 IMPLANT
GOWN STRL REUS W/TWL LRG LVL3 (GOWN DISPOSABLE) ×9 IMPLANT
KIT ABG SYR 3ML LUER SLIP (SYRINGE) IMPLANT
NEEDLE HYPO 25X5/8 SAFETYGLIDE (NEEDLE) IMPLANT
NS IRRIG 1000ML POUR BTL (IV SOLUTION) ×3 IMPLANT
PACK C SECTION WH (CUSTOM PROCEDURE TRAY) ×3 IMPLANT
PAD OB MATERNITY 4.3X12.25 (PERSONAL CARE ITEMS) ×3 IMPLANT
PENCIL SMOKE EVAC W/HOLSTER (ELECTROSURGICAL) ×3 IMPLANT
RETRACTOR TRAXI PANNICULUS (MISCELLANEOUS) ×1 IMPLANT
RTRCTR C-SECT PINK 25CM LRG (MISCELLANEOUS) ×3 IMPLANT
SPONGE LAP 18X18 RF (DISPOSABLE) ×9 IMPLANT
STRIP CLOSURE SKIN 1/2X4 (GAUZE/BANDAGES/DRESSINGS) ×2 IMPLANT
SUT VIC AB 0 CTX 36 (SUTURE) ×8
SUT VIC AB 0 CTX36XBRD ANBCTRL (SUTURE) ×4 IMPLANT
SUT VIC AB 2-0 CT1 27 (SUTURE) ×2
SUT VIC AB 2-0 CT1 TAPERPNT 27 (SUTURE) ×1 IMPLANT
SUT VIC AB 4-0 KS 27 (SUTURE) ×3 IMPLANT
TOWEL OR 17X24 6PK STRL BLUE (TOWEL DISPOSABLE) ×3 IMPLANT
TRAXI PANNICULUS EXTENDER (MISCELLANEOUS) ×2
TRAXI PANNICULUS RETRACTOR (MISCELLANEOUS) ×2
TRAY FOLEY W/BAG SLVR 14FR LF (SET/KITS/TRAYS/PACK) ×3 IMPLANT

## 2018-02-17 NOTE — Anesthesia Postprocedure Evaluation (Signed)
Anesthesia Post Note  Patient: Miranda Ruiz  Procedure(s) Performed: CESAREAN SECTION (N/A )     Patient location during evaluation: PACU Anesthesia Type: Spinal Level of consciousness: awake and alert Pain management: pain level controlled Vital Signs Assessment: post-procedure vital signs reviewed and stable Respiratory status: spontaneous breathing, nonlabored ventilation and respiratory function stable Cardiovascular status: blood pressure returned to baseline and stable Postop Assessment: no apparent nausea or vomiting Anesthetic complications: no    Last Vitals:  Vitals:   02/17/18 1530 02/17/18 1545  BP: 122/78 134/71  Pulse: 61 79  Resp: 18 20  Temp:    SpO2: 96% 95%    Last Pain:  Vitals:   02/17/18 1545  TempSrc:   PainSc: Asleep   Pain Goal:    LLE Motor Response: Purposeful movement (02/17/18 1545)   RLE Motor Response: Purposeful movement (02/17/18 1545)        Kaylyn LayerKathryn E Ariv Penrod

## 2018-02-17 NOTE — Lactation Note (Signed)
This note was copied from a baby's chart. Lactation Consultation Note  Patient Name: Miranda Ruiz [id number]'s Date: 02/17/2018 Reason for consult: Initial assessment;Term;Other (Comment);Maternal endocrine disorder(AMA) Type of Endocrine Disorder?: Diabetes  8 hours old FT female who is being formula fed by his mother at this point, she's a P2; baby is on [employer]. Per mom her first baby never latched on, so she just formula fed the first time. She told LC she had to give baby formula due to his sugar but does plan on BF at some point while in the hospital. Offered assistance with latch, but mom politely declined because baby had already fed. Asked mom to call for assistance when needed.  Baby had a medium emesis of undigested/curded formula while on LC consultation. Reviewed with mom volumes required for supplementation according the baby's age. LC also showed mom how to hand express, only a droplet of colostrum was visible, mom has very large breasts, but her nipples are average size and compressible; mom is a bariatric patient.  Mom doesn't have a pump at home, [facility], pump instructions, cleaning and storage were reviewed, as well as milk storage guidelines.   Feeding plan:  1. Encouraged mom to feed baby STS 8-12 times/24 hours or sooner if feeding cues are present 2. Mom will follow supplementation chart volumes according to baby's age 343. Hand expression and spoon feeding was also encouraged  BF brochure. BF resources and feeding diary were reviewed. Parents reported all questions and concerns were answered, they're both aware of LC services and will call PRN.  Maternal Data Formula Feeding for Exclusion: Yes Reason for exclusion: Mother's choice to formula and breast feed on admission Has patient been taught Hand Expression?: Yes Does the patient have breastfeeding experience prior to this delivery?: No(Per mom her first baby never latched)  Feeding     LATCH Score Latch: (Told mom to call out when latching)    Interventions Interventions: Breast feeding basics reviewed;Hand express;Breast massage;Breast compression;Hand pump  Lactation Tools Discussed/Used Tools: Pump Breast pump type: Manual WIC Program: No Pump Review: Setup, frequency, and cleaning;Milk Storage Initiated by:: MPeck Date initiated:: 02/17/18   Consult Status Consult Status: Follow-up Date: 02/18/18 Follow-up type: In-patient    Wyatt Galvan Venetia ConstableS Corisa Montini 02/17/2018, 10:28 PM

## 2018-02-17 NOTE — Transfer of Care (Signed)
Immediate Anesthesia Transfer of Care Note  Patient: Miranda Ruiz  Procedure(s) Performed: CESAREAN SECTION (N/A )  Patient Location: PACU  Anesthesia Type:Spinal  Level of Consciousness: awake, alert  and oriented  Airway & Oxygen Therapy: Patient Spontanous Breathing  Post-op Assessment: Report given to RN and Post -op Vital signs reviewed and stable  Post vital signs: Reviewed  Last Vitals:  Vitals Value Taken Time  BP    Temp    Pulse 76 02/17/2018  3:00 PM  Resp 16 02/17/2018  3:00 PM  SpO2 100 % 02/17/2018  3:00 PM  Vitals shown include unvalidated device data.  Last Pain:  Vitals:   02/17/18 1105  TempSrc: Oral  PainSc:          Complications: No apparent anesthesia complications

## 2018-02-17 NOTE — Anesthesia Procedure Notes (Signed)
Spinal  Start time: 02/17/2018 12:56 PM End time: 02/17/2018 1:01 PM Staffing Anesthesiologist: Kaylyn Layer, MD Other anesthesia staff: Lelon Perla, RN Performed: anesthesiologist and other anesthesia staff  Preanesthetic Checklist Completed: patient identified, site marked, surgical consent, pre-op evaluation, timeout performed, IV checked, risks and benefits discussed and monitors and equipment checked Spinal Block Patient position: sitting Prep: ChloraPrep Patient monitoring: continuous pulse ox, blood pressure and heart rate Approach: midline Location: L4-5 Needle Needle type: Pencan  Needle gauge: 24 G Needle length: 9 cm Assessment Sensory level: T4

## 2018-02-17 NOTE — H&P (Signed)
Obstetric Preoperative History and Physical  Miranda Ruiz is a 37 y.o. G2P1001 with IUP at 65w2dpresenting for scheduled cesarean section.  No acute concerns. Prior C-section due to fetal macrosomia.   [facility] Pregnancy complications or risks: Patient Active Problem List   Diagnosis Date Noted  . Previous cesarean section 02/17/2018  . Group B Streptococcus carrier, +RV culture, currently pregnant 02/09/2018  . Gestational diabetes 12/02/2017  . Advanced maternal age in multigravida 10/24/2017  . Prediabetes 10/24/2017  . Supervision of normal first pregnancy, antepartum 09/15/2017  . Obesity during pregnancy 09/15/2017  . H/O: C-section 09/15/2017  . H/O gestational diabetes in prior pregnancy, currently pregnant 09/15/2017  A2GDM on Insulin, EFW 3684g, >90% with AC 97% on 01/30/18   She plans to breastfeed She desires no method for postpartum contraception.   Prenatal labs and studies: ABO, Rh: --/--/O POS, O POS [facility] [address] [place] [address] [phone] Antibody: NEG (11/11 1128) Rubella: 2.34 (06/10 1000) RPR: Non Reactive (11/11 1128)  HBsAg: Negative (06/10 1000)  HIV: NON REACTIVE (11/11 1128)  GBS: Positive  2 hr Glucola  Abnormal: 97/193/163 Genetic screening normal Anatomy UKoreanormal  Prenatal Transfer Tool  Maternal Diabetes: Yes:  Diabetes Type:  Insulin/Medication controlled Genetic Screening: Normal Maternal Ultrasounds/Referrals: Normal Fetal Ultrasounds or other Referrals:  Referred to Materal Fetal Medicine  Maternal Substance Abuse:  No Significant Maternal Medications:  Meds include: Other:  Insulin  Significant Maternal Lab Results: Lab values include: Group B Strep positive  Past Medical History:  Diagnosis Date  . Gestational diabetes     Past Surgical History:  Procedure Laterality Date  . APPENDECTOMY    . CESAREAN SECTION      OB  History  Gravida Para Term Preterm AB Living  2 1 1  0 0 1  SAB TAB Ectopic Multiple Live Births  0 0 0 0 1    # Outcome Date GA Lbr Len/2nd Weight Sex Delivery Anes PTL Lv  2 Current           1 Term 2008     CS-Unspec   LIV    Social History   Socioeconomic History  . Marital status: Single    Spouse name: Not on file  . Number of children: Not on file  . Years of education: Not on file  . Highest education level: Not on file  Occupational History  . Not on file  Social Needs  . Financial resource strain: Not hard at all  . Food insecurity:    Worry: Never true    Inability: Never true  . Transportation needs:    Medical: No    Non-medical: Not on file  Tobacco Use  . Smoking status: Former Smoker    Last attempt to quit: 05/18/2017    Years since quitting: 0.7  . Smokeless tobacco: Never Used  Substance and Sexual Activity  . Alcohol use: Never    Frequency: Never  . Drug use: Never  . Sexual activity: Yes    Birth control/protection: None  Lifestyle  . Physical activity:    Days per week: Not on file    Minutes per session: Not on file  . Stress: Rather much  Relationships  . Social connections:    Talks on phone: Not on file    Gets together: Not on file    Attends religious service: Not on file  Active member of club or organization: Not on file    Attends meetings of clubs or organizations: Not on file    Relationship status: Not on file  Other Topics Concern  . Not on file  Social History Narrative  . Not on file    Family History  Problem Relation Age of Onset  . Cancer Maternal Aunt     Medications Prior to Admission  Medication Sig Dispense Refill Last Dose  . insulin NPH Human (HUMULIN N,NOVOLIN N) 100 UNIT/ML injection Inject 0.1 mLs (10 Units total) into the skin 2 (two) times daily at 8 am and 10 pm. 10 mL 3   . Prenatal Vit-Fe Fumarate-FA (MULTIVITAMIN-PRENATAL) 27-0.8 MG TABS tablet Take 1 tablet by mouth daily.    Taking  .  ACCU-CHEK FASTCLIX LANCETS MISC 1 each by Percutaneous route 4 (four) times daily. (Patient not taking: Reported on 02/10/2018) 100 each 12 Not Taking at Unknown time  . Blood Glucose Monitoring Suppl (ACCU-CHEK GUIDE ME) w/Device KIT 1 each by Does not apply route 4 (four) times daily. (Patient not taking: Reported on 02/10/2018) 1 kit 0 Not Taking at Unknown time  . glucose blood test strip Use as instructed (Patient not taking: Reported on 02/10/2018) 50 each 12 Not Taking at Unknown time  . Insulin Syringes, Disposable, U-100 0.3 ML MISC 1 each by Does not apply route 2 (two) times daily at 8 am and 10 pm. (Patient not taking: Reported on 02/10/2018) 100 each 0 Not Taking at Unknown time    No Known Allergies  Review of Systems: Negative except for what is mentioned in HPI.  Physical Exam: BP 137/86   Pulse 94   Temp 98.1 F (36.7 C) (Oral)   Resp 18   Ht 5' 3"  (1.6 m)   Wt (!) 146.1 kg   LMP 05/18/2017 (Approximate)   BMI 57.06 kg/m  FHR by Doppler: 138 bpm CONSTITUTIONAL: Well-developed, well-nourished female in no acute distress.  HENT:  Normocephalic, atraumatic. Oropharynx is clear and moist EYES: Conjunctivae and EOM are normal. No scleral icterus.  NECK: Normal range of motion, supple SKIN: Skin is warm and dry. No rash noted. Not diaphoretic. No erythema. [place]: Alert and oriented to person, place, and time. Normal reflexes, muscle tone coordination. No cranial nerve deficit noted. PSYCHIATRIC: Normal mood and affect. Normal behavior. CARDIOVASCULAR: Normal heart rate noted, regular rhythm RESPIRATORY: Effort and breath sounds normal, no problems with respiration noted ABDOMEN: Soft, nontender, nondistended, gravid. Well-healed Pfannenstiel incision. PELVIC: Deferred MUSCULOSKELETAL: Normal range of motion. No edema and no tenderness. 2+ distal pulses.   Pertinent Labs/Studies:   Results for orders placed or performed during the hospital encounter of 02/17/18 (from  the past 72 hour(s))  Glucose, capillary     Status: None   Collection Time: 02/17/18 10:56 AM  Result Value Ref Range   Glucose-Capillary 90 70 - 99 mg/dL    Assessment and Plan :Miranda Ruiz is a 37 y.o. G2P1001 at 30w2dbeing admitted for scheduled cesarean section. The risks of cesarean section discussed with the patient included but were not limited to: bleeding which may require transfusion or reoperation; infection which may require antibiotics; injury to bowel, bladder, ureters or other surrounding organs; injury to the fetus; need for additional procedures including hysterectomy in the event of a life-threatening hemorrhage; placental abnormalities wth subsequent pregnancies, incisional problems, thromboembolic phenomenon and other postoperative/anesthesia complications. The patient concurred with the proposed plan, giving informed written consent for the procedure. Patient has been  NPO since 10:30 am when she drank water, otherwise no PO intake, she will remain NPO for procedure. Anesthesia and OR aware. Preoperative prophylactic antibiotics and SCDs ordered on call to the OR. To OR when ready.    Phill Myron, D.O. OB Fellow  02/17/2018, 11:50 AM

## 2018-02-17 NOTE — Op Note (Signed)
Cesarean Section Operative Report  PATIENT: Miranda Ruiz  PROCEDURE DATE: 02/17/2018  PREOPERATIVE DIAGNOSES: Intrauterine pregnancy at [redacted]w[redacted]d weeks gestation; patient declines vag del attempt  POSTOPERATIVE DIAGNOSES: The same  PROCEDURE: Repeat Low Transverse Cesarean Section  SURGEON:   Surgeon(s) and Role:    * Levie Heritage, DO - Primary    * Arvilla Market, DO - Assisting - OB Fellow    * Oralia Manis, DO -Assisting - Resident    INDICATIONS: Miranda Ruiz is a 37 y.o. G2P1001 at [redacted]w[redacted]d here for cesarean section secondary to the indications listed under preoperative diagnoses; please see preoperative note for further details.  The risks of cesarean section were discussed with the patient including but were not limited to: bleeding which may require transfusion or reoperation; infection which may require antibiotics; injury to bowel, bladder, ureters or other surrounding organs; injury to the fetus; need for additional procedures including hysterectomy in the event of a life-threatening hemorrhage; placental abnormalities wth subsequent pregnancies, incisional problems, thromboembolic phenomenon and other postoperative/anesthesia complications.   The patient concurred with the proposed plan, giving informed written consent for the procedure.    FINDINGS: Adherent rectus muscles bilaterally. Viable female infant in cephalic presentation. Nuchal cord x1, reduced after delivery.  Apgars 9 and 9.  Clear amniotic fluid.  Intact placenta, three vessel cord.  Normal uterus, fallopian tubes and ovaries bilaterally.  ANESTHESIA: Spinal INTRAVENOUS FLUIDS: 2000 mL  Quantitative BLOOD LOSS: 201 mL  Estimated Blood Loss: 750 mL  URINE OUTPUT:  100 ml SPECIMENS: Placenta sent to L&D COMPLICATIONS: None immediate  PROCEDURE IN DETAIL:  The patient preoperatively received intravenous antibiotics and had sequential compression devices applied to her lower extremities.  She was then taken  to the operating room where spinal anesthesia was administered and was found to be adequate. She was then placed in a dorsal supine position with a leftward tilt, and prepped and draped in a sterile manner.  A foley catheter was placed into her bladder and attached to constant gravity.    After an adequate timeout was performed, a Pfannenstiel skin incision was made with scalpel over her preexisting scarand carried through to the underlying layer of fascia. The fascia was incised in the midline, and this incision was extended bilaterally using the Mayo scissors.  Kocher clamps were applied to the superior aspect of the fascial incision and the underlying rectus muscles were dissected off bluntly.  A similar process was carried out on the inferior aspect of the fascial incision. Given adherent rectus muscles, 3 cm dissection was performed bilaterally with the modified Maylard technique.and the peritoneum was entered bluntly. The vesicouterine peritoneum was identified, tented up and entered with the metzenbaum scissors. This incision was extended laterally and the bladder flap was created digitally.Attention was turned to the lower uterine segment where a low transverse hysterotomy was made with a scalpel and extended bilaterally bluntly.  The infant was successfully delivered, the cord was clamped and cut after one minute, and the infant was handed over to the awaiting neonatology team. Uterine massage was then administered, and the placenta delivered intact with a three-vessel cord. The uterus was then cleared of clots and debris.  The hysterotomy was closed with 0 Vicryl in a running locked fashion, and an imbricating layer was also placed with 0 Vicryl.  Figure-of-eight 0 Vicryl serosal stitches were placed to help with hemostasis.  The pelvis was cleared of all clot and debris. Hemostasis was confirmed on all surfaces.  The fascia was  then closed using 0 Vicryl in a running fashion.  The subcutaneous layer was  irrigated, then reapproximated with 2-0 plain gut interrupted stitches.The skin was closed with a 4-0 Vicryl subcuticular stitch. A prevena wound vac was placed.   The patient tolerated the procedure well. Sponge, lap, instrument and needle counts were correct x 3.  She was taken to the recovery room in stable condition.   An experienced assistant was required given the standard of surgical care given the complexity of the case.  This assistant was needed for exposure, dissection, suctioning, retraction, instrument exchange, assisting with delivery with administration of fundal pressure, and for overall help during the procedure.   Maternal Disposition: PACU - hemodynamically stable.   Infant Disposition: stable   Miranda Ruiz, D.O. OB Fellow  02/17/2018, 2:35 PM

## 2018-02-18 LAB — CBC
HCT: 32.2 % — ABNORMAL LOW (ref 36.0–46.0)
Hemoglobin: 10.6 g/dL — ABNORMAL LOW (ref 12.0–15.0)
MCH: 29 pg (ref 26.0–34.0)
MCHC: 32.9 g/dL (ref 30.0–36.0)
MCV: 88.2 fL (ref 80.0–100.0)
PLATELETS: 218 10*3/uL (ref 150–400)
RBC: 3.65 MIL/uL — ABNORMAL LOW (ref 3.87–5.11)
RDW: 13.3 % (ref 11.5–15.5)
WBC: 9.7 10*3/uL (ref 4.0–10.5)
nRBC: 0 % (ref 0.0–0.2)

## 2018-02-18 LAB — CREATININE, SERUM
CREATININE: 0.64 mg/dL (ref 0.44–1.00)
GFR calc non Af Amer: >60 mL/min (ref >60)

## 2018-02-18 LAB — GLUCOSE, CAPILLARY: GLUCOSE-CAPILLARY: 148 mg/dL — AB (ref 70–99)

## 2018-02-18 LAB — BIRTH TISSUE RECOVERY COLLECTION (PLACENTA DONATION)

## 2018-02-18 NOTE — Addendum Note (Signed)
Addendum  created 02/18/18 [id number] by Elgie CongoMalinova, Anayelli Lai H, CRNA   Sign clinical note

## 2018-02-18 NOTE — Anesthesia Postprocedure Evaluation (Signed)
Anesthesia Post Note  Patient: Ace Ginsron Noguera  Procedure(s) Performed: CESAREAN SECTION (N/A )     Patient location during evaluation: Mother Baby Anesthesia Type: Spinal Level of consciousness: awake and alert Pain management: pain level controlled Vital Signs Assessment: post-procedure vital signs reviewed and stable Respiratory status: spontaneous breathing, nonlabored ventilation and respiratory function stable Cardiovascular status: stable Postop Assessment: no headache, no backache, spinal receding, no apparent nausea or vomiting, patient able to bend at knees, able to ambulate and adequate PO intake Anesthetic complications: no    Last Vitals:  Vitals:   02/18/18 0405 02/18/18 0904  BP: 109/76 128/81  Pulse: 76 80  Resp:  18  Temp: 36.6 C 36.9 C  SpO2: 99% 97%    Last Pain:  Vitals:   02/18/18 0904  TempSrc: Oral  PainSc: 0-No pain   Pain Goal:                 Land O'LakesMalinova,Annella Prowell Hristova

## 2018-02-18 NOTE — Progress Notes (Signed)
POSTPARTUM PROGRESS NOTE  POD #1  Subjective:  Miranda Ruiz is a 37 y.o. G2P2002 s/p rLTCS at 965w2d.  She reports she is doing well. No acute events overnight.  She denies any problems with ambulating or po intake. Foley catheter was removed this morning, has not voided as of yet. Denies nausea or vomiting. She has not passed flatus. Pain is well controlled, rates 2/10 this morning around incision site.  Lochia is moderate.  Objective: Blood pressure 109/76, pulse 76, temperature 97.9 F (36.6 C), temperature source Oral, resp. rate 18, height 5\' 3"  (1.6 m), weight (!) 146.1 kg, last menstrual period 05/18/2017, SpO2 99 %, unknown if currently breastfeeding.  Physical Exam:  General: alert, cooperative and no distress Chest: no respiratory distress Heart:regular rate, distal pulses intact Abdomen: soft, nontender,  Uterine Fundus: firm, appropriately tender DVT Evaluation: No calf swelling or tenderness Extremities: No edema Skin: warm, dry; incision intact with dried blood; w/prevena in place  Recent Labs    02/16/18 1128 02/18/18 0507  HGB 12.1 10.6*  HCT 36.1 32.2*    Assessment/Plan: Miranda Ginsron Ugarte is a 37 y.o. [id number] s/p rLTCS at 9065w2d for declined vaginal delivery. Pregnancy complicated by A2GDM controlled with insulin. Voiding trial today.   POD#1 - Doing welll; pain well controlled. H/H appropriate (12.1-->10.6)  Routine postpartum care  OOB, ambulated  Lovenox for VTE prophylaxis Anemia: asymptomatic  MCV 88 this morning, continue to monitor CBG: 148 this morning (90 and 75 last night). Continue to monitor.  Circumcision: possibly inpatient, calling insurance today.  Contraception:decline Feeding: both  Dispo: Plan for discharge pending voiding trial, pain control, and wound care.    LOS: 1 day   Jan FiremanKelsey Shawndrea Rutkowski, PA-S 02/18/2018

## 2018-02-19 MED ORDER — IBUPROFEN 600 MG PO TABS: 600.0000 mg | ORAL_TABLET | Freq: Four times a day (QID) | ORAL | 0 refills | Status: DC

## 2018-02-19 NOTE — Progress Notes (Signed)
POSTPARTUM PROGRESS NOTE  POD #2  Subjective:  Miranda Ruiz is a 37 y.o. G2P2002 s/p rLTCS at 2536w2d.  She reports she doing well. No acute events overnight. She reports she is doing well. She denies any problems with ambulating, voiding or po intake. Denies nausea or vomiting. She has passed flatus but denies BM. Pain is well controlled.  Lochia is light.  Objective: Blood pressure 112/73, pulse 74, temperature 97.7 F (36.5 C), resp. rate 18, height 5\' 3"  (1.6 m), weight (!) 146.1 kg, last menstrual period 05/18/2017, SpO2 99 %, unknown if currently breastfeeding.  Physical Exam:  General: alert, cooperative and no distress Chest: no respiratory distress Heart:regular rate, distal pulses intact Abdomen: soft, nontender,  Uterine Fundus: firm, appropriately tender DVT Evaluation: No calf swelling or tenderness Extremities: no edema Skin: warm, dry; incision clean/dry/intact w/ honeycomb dressing in place  Recent Labs    02/16/18 1128 02/18/18 0507  HGB 12.1 10.6*  HCT 36.1 32.2*    Assessment/Plan: Miranda Ginsron Rudman is a 37 y.o. [id number] s/p rLTCS at 5536w2d for scheduled repeat.  POD#2 - Doing welll; pain well controlled. H/H appropriate (12.1>10.6)  Routine postpartum care  OOB, ambulated  SCDs and ambulation for VTE prophylaxis  Anemia: asymptomatic   Contraception: NFP Feeding: breast   Dispo: Plan for discharge 02/20/18.   LOS: 2 days   Oralia ManisSherin Damyon Mullane, DO PGY-2 02/19/2018, 7:52 AM

## 2018-02-19 NOTE — Discharge Summary (Addendum)
Postpartum Discharge Summary     Patient Name: Miranda Ruiz DOB: 18-Oct-1980 MRN: [id number]  Date of admission: 02/17/2018 Delivering Provider: Truett Mainland   Date of discharge: 02/19/2018  Admitting diagnosis: RCS Intrauterine pregnancy: [redacted]w[redacted]d    Secondary diagnosis:  Active Problems:   Obesity during pregnancy   Advanced maternal age in multigravida   Gestational diabetes   Group B Streptococcus carrier, +RV culture, currently pregnant   Previous cesarean section   Status post repeat low transverse cesarean section  Additional problems:  - previous c/s - GBS Pos - A2GDM - AMA - Obesity      Discharge diagnosis: Term Pregnancy Delivered                                                                                                Post partum procedures:Prevena placement  Augmentation: none  Complications: None  Hospital course:  Sceduled C/S   37y.o. yo G2P2002 at 321w2das admitted to the hospital 02/17/2018 for scheduled cesarean section with the following indication:Elective Repeat.  Membrane Rupture Time/Date: 1:41 PM ,02/17/2018   Patient delivered a Viable infant.02/17/2018  Details of operation can be found in separate operative note.  Pateint had an uncomplicated postpartum course.  She is ambulating, tolerating a regular diet, passing flatus, and urinating well. Patient is discharged home in stable condition on  02/19/18         Magnesium Sulfate recieved: No BMZ received: No  Physical exam  Vitals:   02/18/18 1146 02/18/18 1440 02/18/18 2210 02/19/18 0522  BP: 133/72 119/61 126/67 112/73  Pulse: 87 81 86 74  Resp: _0 Temp: 98.6 F (37 C) 98.5 F (36.9 C) 97.7 F (36.5 C) 97.7 F (36.5 C)  TempSrc: Oral Oral Oral   SpO2: 100% 100% 100% 99%  Weight:      Height:       General: alert, cooperative and no distress Lochia: appropriate Uterine Fundus: firm Incision: Healing well with no significant drainage, No significant erythema,  Dressing is clean, dry, and intact DVT Evaluation: No evidence of DVT seen on physical exam. No cords or calf tenderness. No significant calf/ankle edema. Labs: Lab Results  Component Value Date   WBC 9.7 02/18/2018   HGB 10.6 (L) 02/18/2018   HCT 32.2 (L) 02/18/2018   MCV 88.2 02/18/2018   PLT 218 02/18/2018   CMP Latest Ref Rng & Units 02/18/2018  Creatinine 0.44 - 1.00 mg/dL 0.64    Discharge instruction: per After Visit Summary and "Baby and Me Booklet".  After visit meds:  Allergies as of 02/19/2018   No Known Allergies     Medication List    STOP taking these medications   insulin NPH Human 100 UNIT/ML injection Commonly known as:  HUMULIN N,NOVOLIN N     TAKE these medications   ACCU-CHEK FASTCLIX LANCETS Misc 1 each by Percutaneous route 4 (four) times daily.   ACCU-CHEK GUIDE ME w/Device Kit 1 each by Does not apply route 4 (four) times daily.   glucose blood test strip Use as instructed  ibuprofen 600 MG tablet Commonly known as:  ADVIL,MOTRIN Take 1 tablet (600 mg total) by mouth every 6 (six) hours.   Insulin Syringes (Disposable) U-100 0.3 ML Misc 1 each by Does not apply route 2 (two) times daily at 8 am and 10 pm.   multivitamin-prenatal 27-0.8 MG Tabs tablet Take 1 tablet by mouth daily.       Diet: routine diet  Activity: Advance as tolerated. Pelvic rest for 6 weeks.   Outpatient follow up:1 week for Provena removal Follow up Appt: Future Appointments  Date Time Provider [place]  03/03/2018  1:30 PM [place]  04/02/2018  8:20 AM [facility]  04/02/2018  8:55 AM [place], Mervyn Skeeters, CNM [facility]   Follow up Visit:   Please schedule this patient for Postpartum visit in: 1 week with the following provider: MD For C/S patients schedule nurse incision check in weeks 2 weeks: yes High risk pregnancy complicated by: GDM Delivery mode:  CS Anticipated Birth Control:  none PP  Procedures needed: provena removal, 2hr GTT  Schedule Integrated BH visit: no      Newborn Data: Live born female  Birth Weight: 7 lb 15.9 oz (3626 g) APGAR: 9, 9  Newborn Delivery   Birth date/time:  02/17/2018 13:41:00 Delivery type:  C-Section, Low Transverse Trial of labor:  No C-section categorization:  Repeat     Baby Feeding: Breast Disposition:home with mother   02/19/2018 Caroline More, DO  PGY-2  CNM attestation I have seen and examined this patient and agree with above documentation in the resident's note.   Miranda Ruiz is a 37 y.o. G2P2002 s/p rLTCS.   Pain is well controlled.  Plan for birth control is no method.  Method of Feeding: breast  PE:  BP 112/73   Pulse 74   Temp 97.7 F (36.5 C)   Resp 18   Ht _0  (1.6 m)   Wt (!) 146.1 kg   LMP 05/18/2017 (Approximate)   SpO2 99%   Breastfeeding? Unknown   BMI 57.06 kg/m  Fundus firm  Recent Labs    02/18/18 0507  HGB 10.6*  HCT 32.2*   Preop hgb: 12.1  Plan: discharge today - postpartum care discussed - f/u clinic in 1wk for BP and inc check; 6 weeks for postpartum visit and GTT   Myrtis Ser, CNM 1:14 AM

## 2018-02-19 NOTE — Discharge Instructions (Signed)
Cesarean Delivery, Care After Refer to this sheet in the next few weeks. These instructions provide you with information about caring for yourself after your procedure. Your health care provider may also give you more specific instructions. Your treatment has been planned according to current medical practices, but problems sometimes occur. Call your health care provider if you have any problems or questions after your procedure. What can I expect after the procedure? After the procedure, it is common to have:  A small amount of blood or clear fluid coming from the incision.  Some redness, swelling, and pain in your incision area.  Some abdominal pain and soreness.  Vaginal bleeding (lochia).  Pelvic cramps.  Fatigue.  Follow these instructions at home: Incision care   Follow instructions from your health care provider about how to take care of your incision. Make sure you: ? Wash your hands with soap and water before you change your bandage (dressing). If soap and water are not available, use hand sanitizer. ? If you have a dressing, change it as told by your health care provider. ? Leave stitches (sutures), skin staples, skin glue, or adhesive strips in place. These skin closures may need to stay in place for 2 weeks or longer. If adhesive strip edges start to loosen and curl up, you may trim the loose edges. Do not remove adhesive strips completely unless your health care provider tells you to do that.  Check your incision area every day for signs of infection. Check for: ? More redness, swelling, or pain. ? More fluid or blood. ? Warmth. ? Pus or a bad smell.  When you cough or sneeze, hug a pillow. This helps with pain and decreases the chance of your incision opening up (dehiscing). Do this until your incision heals. Medicines  Take over-the-counter and prescription medicines only as told by your health care provider.  If you were prescribed an antibiotic medicine, take it  as told by your health care provider. Do not stop taking the antibiotic until it is finished. Driving  Do not drive or operate heavy machinery while taking prescription pain medicine. Lifestyle  Do not drink alcohol. This is especially important if you are breastfeeding or taking pain medicine.  Do not use tobacco products, including cigarettes, chewing tobacco, or e-cigarettes. If you need help quitting, ask your health care provider. Tobacco can delay wound healing. Eating and drinking  Drink at least 8 eight-ounce glasses of water every day unless told not to by your health care provider. If you breastfeed, you may need to drink more water than this.  Eat high-fiber foods every day. These foods may help prevent or relieve constipation. High-fiber foods include: ? Whole grain cereals and breads. ? Brown rice. ? Beans. ? Fresh fruits and vegetables. Activity  Return to your normal activities as told by your health care provider. Ask your health care provider what activities are safe for you.  Rest as much as possible. Try to rest or take a nap while your baby is sleeping.  Do not lift anything that is heavier than your baby or 10 lb (4.5 kg) as told by your health care provider.  Ask your health care provider when you can engage in sexual activity. This may depend on your: ? Risk of infection. ? Healing rate. ? Comfort and desire to engage in sexual activity. Bathing  Do not take baths, swim, or use a hot tub until your health care provider approves. Ask your health care provider if  you can take showers. You may only be allowed to take sponge baths until your incision heals. General instructions  Do not use tampons or douches until your health care provider approves.  Wear: ? Loose, comfortable clothing. ? A supportive and well-fitting bra.  Watch for any blood clots that may pass from your vagina. These may look like clumps of dark red, brown, or black discharge.  Keep  your perineum clean and dry as told by your health care provider.  Wipe from front to back when you use the toilet.  If possible, have someone help you care for your baby and help with household activities for a few days after you leave the hospital.  Keep all follow-up visits for you and your baby as told by your health care provider. This is important. Contact a health care provider if:  You have: ? Bad-smelling vaginal discharge. ? Difficulty urinating. ? Pain when urinating. ? A sudden increase or decrease in the frequency of your bowel movements. ? More redness, swelling, or pain around your incision. ? More fluid or blood coming from your incision. ? Pus or a bad smell coming from your incision. ? A fever. ? A rash. ? Little or no interest in activities you used to enjoy. ? Questions about caring for yourself or your baby. ? Nausea.  Your incision feels warm to the touch.  Your breasts turn red or become painful or hard.  You feel unusually sad or worried.  You vomit.  You pass large blood clots from your vagina. If you pass a blood clot, save it to show to your health care provider. Do not flush blood clots down the toilet without showing your health care provider.  You urinate more than usual.  You are dizzy or light-headed.  You have not breastfed and have not had a menstrual period for 12 weeks after delivery.  You stopped breastfeeding and have not had a menstrual period for 12 weeks after stopping breastfeeding. Get help right away if:  You have: ? Pain that does not go away or get better with medicine. ? Chest pain. ? Difficulty breathing. ? Blurred vision or spots in your vision. ? Thoughts about hurting yourself or your baby. ? New pain in your abdomen or in one of your legs. ? A severe headache.  You faint.  You bleed from your vagina so much that you fill two sanitary pads in one hour. This information is not intended to replace advice given to  you by your health care provider. Make sure you discuss any questions you have with your health care provider. Document Released: 12/15/2001 Document Revised: 04/27/2016 Document Reviewed: 02/27/2015 Elsevier Interactive Patient Education  2018 [employer]  Home Care Instructions for Mom ACTIVITY  Gradually return to your regular activities.  Let yourself rest. Nap while your baby sleeps.  Avoid lifting anything that is heavier than 10 lb (4.5 kg) until your health care provider says it is okay.  Avoid activities that take a lot of effort and energy (are strenuous) until approved by your health care provider. Walking at a slow-to-moderate pace is usually safe.  If you had a cesarean delivery: ? Do not vacuum, climb stairs, or drive a car for 4-6 weeks. ? Have someone help you at home until you feel like you can do your usual activities yourself. ? Do exercises as told by your health care provider, if this applies.  VAGINAL BLEEDING You may continue to bleed for 4-6 weeks after  delivery. Over time, the amount of blood usually decreases and the color of the blood usually gets lighter. However, the flow of bright red blood may increase if you have been too active. If you need to use more than one pad in an hour because your pad gets soaked, or if you pass a large clot:  Lie down.  Raise your feet.  Place a cold compress on your lower abdomen.  Rest.  Call your health care provider.  If you are breastfeeding, your period should return anytime between 8 weeks after delivery and the time that you stop breastfeeding. If you are not breastfeeding, your period should return 6-8 weeks after delivery. PERINEAL CARE The perineal area, or perineum, is the part of your body between your thighs. After delivery, this area needs special care. Follow these instructions as told by your health care provider.  Take warm tub baths for 15-20 minutes.  Use medicated pads and pain-relieving sprays  and creams as told.  Do not use tampons or douches until vaginal bleeding has stopped.  Each time you go to the bathroom: ? Use a peri bottle. ? Change your pad. ? Use towelettes in place of toilet paper until your stitches have healed.  Do Kegel exercises every day. Kegel exercises help to maintain the muscles that support the vagina, bladder, and bowels. You can do these exercises while you are standing, sitting, or lying down. To do Kegel exercises: ? Tighten the muscles of your abdomen and the muscles that surround your birth canal. ? Hold for a few seconds. ? Relax. ? Repeat until you have done this 5 times in a row.  To prevent hemorrhoids from developing or getting worse: ? Drink enough fluid to keep your urine clear or pale yellow. ? Avoid straining when having a bowel movement. ? Take over-the-counter medicines and stool softeners as told by your health care provider.  BREAST CARE  Wear a tight-fitting bra.  Avoid taking over-the-counter pain medicine for breast discomfort.  Apply ice to the breasts to help with discomfort as needed: ? Put ice in a plastic bag. ? Place a towel between your skin and the bag. ? Leave the ice on for 20 minutes or as told by your health care provider.  NUTRITION  Eat a well-balanced diet.  Do not try to lose weight quickly by cutting back on calories.  Take your prenatal vitamins until your postpartum checkup or until your health care provider tells you to stop.  POSTPARTUM DEPRESSION You may find yourself crying for no apparent reason and unable to cope with all of the changes that come with having a newborn. This mood is called postpartum depression. Postpartum depression happens because your hormone levels change after delivery. If you have postpartum depression, get support from your partner, friends, and family. If the depression does not go away on its own after several weeks, contact your health care provider. BREAST SELF-EXAM Do  a breast self-exam each month, at the same time of the month. If you are breastfeeding, check your breasts just after a feeding, when your breasts are less full. If you are breastfeeding and your period has started, check your breasts on day 5, 6, or 7 of your period. Report any lumps, bumps, or discharge to your health care provider. Know that breasts are normally lumpy if you are breastfeeding. This is temporary, and it is not a health risk. INTIMACY AND SEXUALITY Avoid sexual activity for at least 3-4 weeks after delivery or  until the brownish-red vaginal flow is completely gone. If you want to avoid pregnancy, use some form of birth control. You can get pregnant after delivery, even if you have not had your period. SEEK MEDICAL CARE IF:  You feel unable to cope with the changes that a child brings to your life, and these feelings do not go away after several weeks.  You notice a lump, a bump, or discharge on your breast.  SEEK IMMEDIATE MEDICAL CARE IF:  Blood soaks your pad in 1 hour or less.  You have: ? Severe pain or cramping in your lower abdomen. ? A bad-smelling vaginal discharge. ? A fever that is not controlled by medicine. ? A fever, and an area of your breast is red and sore. ? Pain or redness in your calf. ? Sudden, severe chest pain. ? Shortness of breath. ? Painful or bloody urination. ? Problems with your vision.  You vomit for 12 hours or longer.  You develop a severe headache.  You have serious thoughts about hurting yourself, your child, or anyone else.  This information is not intended to replace advice given to you by your health care provider. Make sure you discuss any questions you have with your health care provider. Document Released: 03/22/2000 Document Revised: 08/31/2015 Document Reviewed: 09/26/2014 Elsevier Interactive Patient Education  2017 [employer].

## 2018-02-23 ENCOUNTER — Encounter (HOSPITAL_COMMUNITY): Payer: Self-pay | Admitting: Emergency Medicine

## 2018-02-23 ENCOUNTER — Inpatient Hospital Stay (HOSPITAL_COMMUNITY)
Admission: AD | Admit: 2018-02-23 | Discharge: 2018-02-23 | Disposition: A | Discharge status: Home / Self Care | Payer: PRIVATE HEALTH INSURANCE | Source: Ambulatory Visit | Attending: Obstetrics & Gynecology | Admitting: Obstetrics & Gynecology

## 2018-02-23 ENCOUNTER — Other Ambulatory Visit: Payer: Self-pay | Admitting: Obstetrics and Gynecology

## 2018-02-23 ENCOUNTER — Ambulatory Visit (INDEPENDENT_AMBULATORY_CARE_PROVIDER_SITE_OTHER): Payer: Self-pay | Admitting: *Deleted

## 2018-02-23 ENCOUNTER — Encounter: Payer: Self-pay | Admitting: *Deleted

## 2018-02-23 VITALS — BP 158/94 | HR 82 | Wt 318.0 lb

## 2018-02-23 DIAGNOSIS — R030 Elevated blood-pressure reading, without diagnosis of hypertension: Secondary | ICD-10-CM

## 2018-02-23 DIAGNOSIS — O1003 Pre-existing essential hypertension complicating the puerperium: Secondary | ICD-10-CM | POA: Insufficient documentation

## 2018-02-23 DIAGNOSIS — O1205 Gestational edema, complicating the puerperium: Secondary | ICD-10-CM

## 2018-02-23 DIAGNOSIS — Z87891 Personal history of nicotine dependence: Secondary | ICD-10-CM | POA: Insufficient documentation

## 2018-02-23 DIAGNOSIS — Z98891 History of uterine scar from previous surgery: Secondary | ICD-10-CM

## 2018-02-23 LAB — COMPREHENSIVE METABOLIC PANEL
ALT: 33 U/L (ref 0–44)
ANION GAP: 9 (ref 5–15)
AST: 36 U/L (ref 15–41)
Albumin: 3.1 g/dL — ABNORMAL LOW (ref 3.5–5.0)
Alkaline Phosphatase: 73 U/L (ref 38–126)
BUN: 11 mg/dL (ref 6–20)
CHLORIDE: 107 mmol/L (ref 98–111)
CO2: 23 mmol/L (ref 22–32)
Calcium: 8.8 mg/dL — ABNORMAL LOW (ref 8.9–10.3)
Creatinine, Ser: 0.69 mg/dL (ref 0.44–1.00)
GFR calc non Af Amer: >60 mL/min (ref >60)
Glucose, Bld: 84 mg/dL (ref 70–99)
Potassium: 3.8 mmol/L (ref 3.5–5.1)
SODIUM: 139 mmol/L (ref 135–145)
Total Bilirubin: 0.6 mg/dL (ref 0.3–1.2)
Total Protein: 6.6 g/dL (ref 6.5–8.1)

## 2018-02-23 LAB — CBC
HCT: 33.2 % — ABNORMAL LOW (ref 36.0–46.0)
Hemoglobin: 10.6 g/dL — ABNORMAL LOW (ref 12.0–15.0)
MCH: 28.6 pg (ref 26.0–34.0)
MCHC: 31.9 g/dL (ref 30.0–36.0)
MCV: 89.7 fL (ref 80.0–100.0)
PLATELETS: 247 10*3/uL (ref 150–400)
RBC: 3.7 MIL/uL — AB (ref 3.87–5.11)
RDW: 13.4 % (ref 11.5–15.5)
WBC: 4.5 10*3/uL (ref 4.0–10.5)
nRBC: 0 % (ref 0.0–0.2)

## 2018-02-23 LAB — URINALYSIS, ROUTINE W REFLEX MICROSCOPIC
Bilirubin Urine: NEGATIVE
GLUCOSE, UA: NEGATIVE mg/dL
HGB URINE DIPSTICK: NEGATIVE
KETONES UR: NEGATIVE mg/dL
LEUKOCYTES UA: NEGATIVE
Nitrite: NEGATIVE
PROTEIN: NEGATIVE mg/dL
Specific Gravity, Urine: 1.026 (ref 1.005–1.030)
pH: 5 (ref 5.0–8.0)

## 2018-02-23 LAB — PROTEIN / CREATININE RATIO, URINE
Creatinine, Urine: 238 mg/dL
PROTEIN CREATININE RATIO: 0.07 mg/mg{creat} (ref 0.00–0.15)
Total Protein, Urine: 16 mg/dL

## 2018-02-23 MED ORDER — HYDROCHLOROTHIAZIDE 25 MG PO TABS: 25.0000 mg | ORAL_TABLET | Freq: Every day | ORAL | 0 refills | Status: DC

## 2018-02-23 MED ORDER — HYDROCHLOROTHIAZIDE 25 MG PO TABS
25.0000 mg | ORAL_TABLET | Freq: Every day | ORAL | Status: DC
  Filled 2018-02-23 (×2): qty 1

## 2018-02-23 MED ORDER — IBUPROFEN 600 MG PO TABS: 600.0000 mg | ORAL_TABLET | Freq: Once | ORAL | Status: DC

## 2018-02-23 NOTE — MAU Provider Note (Signed)
History    CSN: [id number]  Arrival date and time: 02/23/18 1603  Chief Complaint  Patient presents with  . Headache  . Hypertension   Miranda Ruiz is a 37 y/o G2P2002 who is POD6 from rLTCS presenting to MAU from clinic for evaluation after she was found to have elevated BP readings in clinic (BPmax 158/94) with associated HA and vision changes. She has no history of elevated BP or other hypertensive-related conditions throughout pregnancy.  She woke up this morning around 0500 AM and noticed a diffuse HA with associated vision changes, which she describes as "spots". Pt then went back to sleep for a few hours and when she woke up later still noted a mild HA, however, vision changes had self resolved. Continued to have a mild HA throughout the day. No associated N/V, abdominal pain. Has noticed increased LE edema bilaterally compared to prior over the past few days. Took Motrin earlier today for c-section incision site pain, which she notes helped relieve her HA-related pain.    OB History    Gravida  2   Para  2   Term  2   Preterm  0   AB  0   Living  2     SAB  0   TAB  0   Ectopic  0   Multiple  0   Live Births  2           Past Medical History:  Diagnosis Date  . Gestational diabetes     Past Surgical History:  Procedure Laterality Date  . APPENDECTOMY    . CESAREAN SECTION    . CESAREAN SECTION N/A 02/17/2018   Procedure: CESAREAN SECTION;  Surgeon: Truett Mainland, DO;  Location: [facility];  Service: Obstetrics;  Laterality: N/A;    Family History  Problem Relation Age of Onset  . Cancer Maternal Aunt     Social History   Tobacco Use  . Smoking status: Former Smoker    Last attempt to quit: 05/18/2017    Years since quitting: 0.7  . Smokeless tobacco: Never Used  Substance Use Topics  . Alcohol use: Never    Frequency: Never  . Drug use: Never    Allergies: No Known Allergies  Medications Prior to Admission  Medication  Sig Dispense Refill Last Dose  . ibuprofen (ADVIL,MOTRIN) 600 MG tablet Take 1 tablet (600 mg total) by mouth every 6 (six) hours. 30 tablet 0 Past Week at Unknown time  . Prenatal Vit-Fe Fumarate-FA (MULTIVITAMIN-PRENATAL) 27-0.8 MG TABS tablet Take 1 tablet by mouth daily.    Past Week at Unknown time  . ACCU-CHEK FASTCLIX LANCETS MISC 1 each by Percutaneous route 4 (four) times daily. (Patient not taking: Reported on 02/10/2018) 100 each 12 Not Taking at Unknown time  . Blood Glucose Monitoring Suppl (ACCU-CHEK GUIDE ME) w/Device KIT 1 each by Does not apply route 4 (four) times daily. (Patient not taking: Reported on 02/10/2018) 1 kit 0 Not Taking at Unknown time  . glucose blood test strip Use as instructed (Patient not taking: Reported on 02/10/2018) 50 each 12 Not Taking at Unknown time  . Insulin Syringes, Disposable, U-100 0.3 ML MISC 1 each by Does not apply route 2 (two) times daily at 8 am and 10 pm. (Patient not taking: Reported on 02/10/2018) 100 each 0 Not Taking at Unknown time    Review of Systems  Constitutional: Negative for fever.  Eyes: Positive for visual disturbance. Negative for photophobia.  Cardiovascular: Positive for leg swelling.  Gastrointestinal: Negative for abdominal pain, nausea and vomiting.  Neurological: Positive for headaches.   Physical Exam   Blood pressure 137/82, pulse 80, temperature 97.7 F (36.5 C), temperature source Oral, last menstrual period 05/18/2017, not currently breastfeeding.  Physical Exam  Constitutional: She is oriented to person, place, and time. She appears well-nourished. No distress.  Cardiovascular: Normal rate and regular rhythm.  Murmur heard. Respiratory: Effort normal and breath sounds normal. She has no wheezes. She has no rales.  GI: Soft. She exhibits no distension. There is no tenderness. There is no rebound and no guarding.  Neurological: She is alert and oriented to person, place, and time.    MAU Course   Procedures  MDM 1653 Pt examined. Labs drawn for evaluation. Pt not currently having HA, vision changes, or abdominal pain. CBC, CMP, UPC pending. 1731 CBC indicates Hgb 10.6; platelets WNL (247). AST/ALT WNL (36, 33, respectively) 1743 BP consistently down-trending since admission to MAU. Systolic consistently mid 868Y and diastolic 57K-935L 2174 UPC normal (0.07)   Assessment and Plan  Miranda Ruiz is a 37 y/o G2P2002 POD6 from rLTCS who presents for evaluation from clinic due to elevated BP readings with associated HA and vision changes c/f postpartum pre-eclampsia vs essential HTN.  Essential hypertension: Pt no longer experiencing HA since admission to MAU. Given labs reassuring and WNL and BP consistently down-trending since arrival, less c/f pregnancy-related HTN. Given BP consistently >130/90 during evaluation today and several readings elevated above 130/90 prior to delivery, per pt's chart, will start anti-hypertensive regimen.  -- Start lisinopril 25 mg daily -- Reassess BP at her next postpartum visit -- Plan to check lytes in 3-4 weeks since starting lisinopril -- Advised pt to return for reevaluation if HA returns, worsens, or begins experiencing additional vision changes, abdominal pain, or additional symptoms.   Miranda Ruiz 02/23/2018, 7:13 PM

## 2018-02-23 NOTE — MAU Provider Note (Signed)
History     CSN: [id number]  Arrival date and time: 02/23/18 1603   First Provider Initiated Contact with Patient 02/23/18 1701      Chief Complaint  Patient presents with  . Headache  . Hypertension   HPI  Ms.  Miranda Ruiz is a 37 y.o. year old G92P2002 female at 27w2dweeks gestation [place]. She was seen for a 1 week incision check was the elevated BPs were discovered. She denies H/A, dizziness, blurry vision or epigastric pain.    Past Medical History:  Diagnosis Date  . Gestational diabetes     Past Surgical History:  Procedure Laterality Date  . APPENDECTOMY    . CESAREAN SECTION    . CESAREAN SECTION N/A 02/17/2018   Procedure: CESAREAN SECTION;  Surgeon: STruett Mainland DO;  [place] [facility]  Service: Obstetrics;  Laterality: N/A;    Family History  Problem Relation Age of Onset  . Cancer Maternal Aunt     Social History   Tobacco Use  . Smoking status: Former Smoker    Last attempt to quit: 05/18/2017    Years since quitting: 0.7  . Smokeless tobacco: Never Used  Substance Use Topics  . Alcohol use: Never    Frequency: Never  . Drug use: Never    Allergies: No Known Allergies  Medications Prior to Admission  Medication Sig Dispense Refill Last Dose  . ibuprofen (ADVIL,MOTRIN) 600 MG tablet Take 1 tablet (600 mg total) by mouth every 6 (six) hours. 30 tablet 0 Past Week at Unknown time  . Prenatal Vit-Fe Fumarate-FA (MULTIVITAMIN-PRENATAL) 27-0.8 MG TABS tablet Take 1 tablet by mouth daily.    Past Week at Unknown time  . ACCU-CHEK FASTCLIX LANCETS MISC 1 each by Percutaneous route 4 (four) times daily. (Patient not taking: Reported on 02/10/2018) 100 each 12 Not Taking at Unknown time  . Blood Glucose Monitoring Suppl (ACCU-CHEK GUIDE ME) w/Device KIT 1 each by Does not apply route 4 (four) times daily. (Patient not taking: Reported on 02/10/2018) 1 kit 0 Not Taking at Unknown time   . glucose blood test strip Use as instructed (Patient not taking: Reported on 02/10/2018) 50 each 12 Not Taking at Unknown time  . Insulin Syringes, Disposable, U-100 0.3 ML MISC 1 each by Does not apply route 2 (two) times daily at 8 am and 10 pm. (Patient not taking: Reported on 02/10/2018) 100 each 0 Not Taking at Unknown time    Review of Systems  Constitutional: Negative.   HENT: Negative.   Eyes: Negative.   Respiratory: Negative.   Cardiovascular: Negative.   Gastrointestinal: Negative.   Endocrine: Negative.   Musculoskeletal: Negative.   Allergic/Immunologic: Negative.   Neurological: Positive for headaches ("better since coming to MAU").  Hematological: Negative.   Psychiatric/Behavioral: Negative.    Physical Exam   Patient Vitals for the past 24 hrs:  BP Temp Temp src Pulse Resp SpO2  02/23/18 1957 (!) 137/91 98 F (36.7 C) Oral 83 18 100 %  02/23/18 1902 137/82 - - 80 - -  02/23/18 1847 135/76 - - 84 - -  02/23/18 1832 (!) 144/90 - - 74 - -  02/23/18 1817 (!) 148/92 - - 77 - -  02/23/18 1801 (!) 146/89 - - 79 - -  02/23/18 1746 131/81 - - 72 - -  02/23/18 1731 128/75 - - 77 - -  02/23/18 1728 138/77 - - 76 - -  02/23/18 1716 (!) 134/104 - - 82 - -  02/23/18 1700 (!) 134/91 - - 89 - -  02/23/18 1651 (!) 149/98 - - 82 - -  02/23/18 1647 (!) 132/94 - - 82 - -  02/23/18 1633 (!) 149/77 - Oral 73 - -  02/23/18 1630 - 97.7 F (36.5 C) Oral - - -    Physical Exam  Nursing note and vitals reviewed. Constitutional: She is oriented to person, place, and time. She appears well-developed and well-nourished.  HENT:  Head: Normocephalic and atraumatic.  Eyes: Pupils are equal, round, and reactive to light.  Neck: Normal range of motion. Neck supple.  Cardiovascular: Normal rate.  Respiratory: Effort normal and breath sounds normal.  GI: Soft. Bowel sounds are normal.  Genitourinary:  Genitourinary Comments: deferred  Musculoskeletal: Normal range of motion.   Neurological: She is alert and oriented to person, place, and time. She has normal strength and normal reflexes. No cranial nerve deficit or sensory deficit.  Skin: Skin is warm and dry.  Psychiatric: She has a normal mood and affect. Her behavior is normal. Judgment and thought content normal.    MAU Course  Procedures  MDM CBC CMP P/C Ratio Serial BPs  *Consult with Dr. Ilda Basset @ 1945 - notified of patient's complaints, assessments, lab results, tx plan d/c home with Rx for HCTZ, F/U in 1 wk for BP check - ok to d/c home, agrees with plan  Results for orders placed or performed during the hospital encounter of 02/23/18 (from the past 24 hour(s))  Urinalysis, Routine w reflex microscopic     Status: None   Collection Time: 02/23/18  4:29 PM  Result Value Ref Range   Color, Urine YELLOW YELLOW   APPearance CLEAR CLEAR   Specific Gravity, Urine 1.026 1.005 - 1.030   pH 5.0 5.0 - 8.0   Glucose, UA NEGATIVE NEGATIVE mg/dL   Hgb urine dipstick NEGATIVE NEGATIVE   Bilirubin Urine NEGATIVE NEGATIVE   Ketones, ur NEGATIVE NEGATIVE mg/dL   Protein, ur NEGATIVE NEGATIVE mg/dL   Nitrite NEGATIVE NEGATIVE   Leukocytes, UA NEGATIVE NEGATIVE  Protein / creatinine ratio, urine     Status: None   Collection Time: 02/23/18  4:29 PM  Result Value Ref Range   Creatinine, Urine 238.00 mg/dL   Total Protein, Urine 16 mg/dL   Protein Creatinine Ratio 0.07 0.00 - 0.15 mg/mg[Cre]  CBC     Status: Abnormal   Collection Time: 02/23/18  4:45 PM  Result Value Ref Range   WBC 4.5 4.0 - 10.5 K/uL   RBC 3.70 (L) 3.87 - 5.11 MIL/uL   Hemoglobin 10.6 (L) 12.0 - 15.0 g/dL   HCT 33.2 (L) 36.0 - 46.0 %   MCV 89.7 80.0 - 100.0 fL   MCH 28.6 26.0 - 34.0 pg   MCHC 31.9 30.0 - 36.0 g/dL   RDW 13.4 11.5 - 15.5 %   Platelets 247 150 - 400 K/uL   nRBC 0.0 0.0 - 0.2 %  Comprehensive metabolic panel     Status: Abnormal   Collection Time: 02/23/18  4:45 PM  Result Value Ref Range   Sodium 139 135 - 145  mmol/L   Potassium 3.8 3.5 - 5.1 mmol/L   Chloride 107 98 - 111 mmol/L   CO2 23 22 - 32 mmol/L   Glucose, Bld 84 70 - 99 mg/dL   BUN 11 6 - 20 mg/dL   Creatinine, Ser 0.69 0.44 - 1.00 mg/dL   Calcium 8.8 (  L) 8.9 - 10.3 mg/dL   Total Protein 6.6 6.5 - 8.1 g/dL   Albumin 3.1 (L) 3.5 - 5.0 g/dL   AST 36 15 - 41 U/L   ALT 33 0 - 44 U/L   Alkaline Phosphatase 73 38 - 126 U/L   Total Bilirubin 0.6 0.3 - 1.2 mg/dL   GFR calc non Af Amer >60 >60 mL/min   GFR calc Af Amer >60 >60 mL/min   Anion gap 9 5 - 15    Assessment and Plan  Gestational edema, postpartum  Elevated blood pressure reading without diagnosis of hypertension  - Rx for HCTZ 25 mg daily x 7 days - BP check in 1 wk // msg sent to [facility] to get pt scheduled (unable to schedule in computer from MAU) - Information provided on [facility]  - Discharge patient - Someone will call for a time to be seen on 03/02/18 - Patient verbalized an understanding of the plan of care and agrees.     Laury Deep, MSN, CNM 02/23/2018, 5:01 PM

## 2018-02-23 NOTE — Progress Notes (Signed)
Rx sent 

## 2018-02-23 NOTE — MAU Note (Signed)
Pt sent from clinic with increase BP, HA w/seeing spots.

## 2018-02-23 NOTE — Progress Notes (Addendum)
Pt agreed with poc and RN accompanied pt to MAU for further eval of elevated B/Ps

## 2018-03-02 ENCOUNTER — Ambulatory Visit: Payer: PRIVATE HEALTH INSURANCE

## 2018-03-03 ENCOUNTER — Other Ambulatory Visit: Payer: Self-pay

## 2018-03-03 ENCOUNTER — Ambulatory Visit: Payer: PRIVATE HEALTH INSURANCE

## 2018-03-03 DIAGNOSIS — O1205 Gestational edema, complicating the puerperium: Secondary | ICD-10-CM

## 2018-03-03 DIAGNOSIS — R030 Elevated blood-pressure reading, without diagnosis of hypertension: Secondary | ICD-10-CM

## 2018-03-03 NOTE — Telephone Encounter (Signed)
Pt requesting refill on HCTZ until her rescheduled appt on 03/11/18.

## 2018-03-10 ENCOUNTER — Ambulatory Visit (INDEPENDENT_AMBULATORY_CARE_PROVIDER_SITE_OTHER): Payer: Self-pay | Admitting: *Deleted

## 2018-03-10 VITALS — BP 125/80 | HR 77 | Wt 301.6 lb

## 2018-03-10 DIAGNOSIS — Z0130 Encounter for examination of blood pressure without abnormal findings: Secondary | ICD-10-CM

## 2018-03-10 NOTE — Progress Notes (Signed)
Agree with RN documentation and plan of care.  Pegge Cumberledge S. Mykell Rawl, DO OB/GYN Fellow  

## 2018-03-10 NOTE — Progress Notes (Signed)
Here for bp check. Had c/s 02/17/18 .Came for wound check 02/23/18 and bp elevated, sent to MAU. Was d/c with HCTZ for 7 days. C/o occasional headaches. Denies edema. Also c/o fell getting into her bed about a week ago to the floor- caught herself with her hands. Wanted wound checked. Wound clean , dry, intact with pink edge noted one spot. No edema, no discharge, no bulging. Reviewed bp and patient report of fall and assessment with Dr.Wallace. Instructed patient to keep postpartum appointment as scheduled; come to MAU for severe headache or sudden edema or if severe pain or issues with her wound.  Linda,RN

## 2018-03-16 ENCOUNTER — Encounter: Payer: Self-pay | Admitting: Obstetrics & Gynecology

## 2018-03-17 ENCOUNTER — Encounter: Payer: Self-pay | Admitting: Family Medicine

## 2018-03-30 ENCOUNTER — Other Ambulatory Visit: Payer: Self-pay | Admitting: *Deleted

## 2018-03-30 DIAGNOSIS — O24429 Gestational diabetes mellitus in childbirth, unspecified control: Secondary | ICD-10-CM

## 2018-03-30 NOTE — Addendum Note (Signed)
Addended by: Jill SideAY, [place] L on: 03/30/2018 02:12 PM   Modules accepted: Orders

## 2018-04-02 ENCOUNTER — Ambulatory Visit: Payer: PRIVATE HEALTH INSURANCE | Admitting: Student

## 2018-04-02 ENCOUNTER — Other Ambulatory Visit: Payer: Self-pay

## 2018-04-02 ENCOUNTER — Ambulatory Visit: Payer: Self-pay | Admitting: Obstetrics and Gynecology

## 2018-04-15 ENCOUNTER — Encounter: Payer: Self-pay | Admitting: Obstetrics and Gynecology

## 2018-04-15 ENCOUNTER — Other Ambulatory Visit: Payer: PRIVATE HEALTH INSURANCE

## 2018-04-15 ENCOUNTER — Encounter: Payer: Self-pay | Admitting: Family Medicine

## 2018-04-15 ENCOUNTER — Ambulatory Visit (INDEPENDENT_AMBULATORY_CARE_PROVIDER_SITE_OTHER): Payer: Self-pay | Admitting: Obstetrics and Gynecology

## 2018-04-15 VITALS — BP 130/83 | HR 65 | Ht 64.0 in | Wt 298.2 lb

## 2018-04-15 DIAGNOSIS — Z8632 Personal history of gestational diabetes: Secondary | ICD-10-CM

## 2018-04-15 DIAGNOSIS — O24429 Gestational diabetes mellitus in childbirth, unspecified control: Secondary | ICD-10-CM

## 2018-04-15 NOTE — Progress Notes (Signed)
Pt is undecided about Birth Control

## 2018-04-15 NOTE — Patient Instructions (Signed)

## 2018-04-15 NOTE — Progress Notes (Signed)
Subjective:     Miranda Ruiz is a 38 y.o. female who presents for a postpartum visit. She is 8 weeks postpartum following a low cervical transverse Cesarean section. I have fully reviewed the prenatal and intrapartum course. The delivery was at *39/2** gestational weeks. Outcome: repeat cesarean section, low transverse incision. Anesthesia: spinal. Postpartum course has been unremarkable. Baby's course has been unremarkable. Baby is feeding by bottle - Similac Sensitive RS. Bleeding no bleeding. Bowel function is normal. Bladder function is normal. Patient is not sexually active. Contraception method is none. Postpartum depression screening: negative.  The following portions of the patient's history were reviewed and updated as appropriate: allergies, current medications, past family history, past medical history, past social history and past surgical history.  Review of Systems Pertinent items noted in HPI and remainder of comprehensive ROS otherwise negative.   Objective:    LMP 05/18/2017 (Approximate)   General:  alert   Breasts:  deferred  Lungs: clear to auscultation bilaterally  Heart:  regular rate and rhythm, S1, S2 normal, no murmur, click, rub or gallop  Abdomen: soft, non-tender; bowel sounds normal; no masses,  no organomegaly, incision well healed   Vulva:  not evaluated  Vagina: not evaluated  Cervix:  not evaluated  Corpus: not examined  Adnexa:  not evaluated  Rectal Exam: Not performed.        Assessment:     Nl postpartum exam.    H/O Gestational DM  Plan:    1. Contraception: condoms 2. Glucola today. Return to ADL's as tolerates 3. Follow up in: 1 yr or as needed.

## 2018-04-16 LAB — GLUCOSE TOLERANCE, 2 HOURS
Glucose, 2 hour: 97 mg/dL (ref 65–139)
Glucose, GTT - Fasting: 87 mg/dL (ref 65–99)

## 2018-10-22 LAB — OB RESULTS CONSOLE ABO/RH: RH Type: POSITIVE

## 2018-10-22 LAB — OB RESULTS CONSOLE GC/CHLAMYDIA
Chlamydia: NEGATIVE
Gonorrhea: NEGATIVE

## 2018-10-22 LAB — OB RESULTS CONSOLE HIV ANTIBODY (ROUTINE TESTING): HIV: NONREACTIVE

## 2018-10-22 LAB — OB RESULTS CONSOLE HGB/HCT, BLOOD
HCT: 37 (ref 29–41)
Hemoglobin: 12.2

## 2018-10-22 LAB — OB RESULTS CONSOLE VARICELLA ZOSTER ANTIBODY, IGG: Varicella: IMMUNE

## 2018-10-22 LAB — OB RESULTS CONSOLE RPR: RPR: NONREACTIVE

## 2018-10-22 LAB — OB RESULTS CONSOLE ANTIBODY SCREEN: Antibody Screen: NEGATIVE

## 2018-10-22 LAB — OB RESULTS CONSOLE RUBELLA ANTIBODY, IGM: Rubella: IMMUNE

## 2018-10-22 LAB — OB RESULTS CONSOLE HEPATITIS B SURFACE ANTIGEN: Hepatitis B Surface Ag: NEGATIVE

## 2018-10-22 LAB — OB RESULTS CONSOLE PLATELET COUNT: Platelets: 282

## 2018-11-26 ENCOUNTER — Encounter: Payer: Self-pay | Admitting: *Deleted

## 2018-11-30 ENCOUNTER — Telehealth: Payer: Self-pay | Admitting: Family Medicine

## 2018-11-30 NOTE — Telephone Encounter (Signed)
Spoke to patient about her appointment on 8/25 @ 9:30. Patient instructed that this visit will be a phone visit and she does not have to come to the office for this appointment. Patient instructed to be available around her appointment. Patient verbalized understanding.

## 2018-12-01 ENCOUNTER — Ambulatory Visit (INDEPENDENT_AMBULATORY_CARE_PROVIDER_SITE_OTHER): Payer: Medicaid - Out of State | Admitting: *Deleted

## 2018-12-01 ENCOUNTER — Encounter: Payer: Self-pay | Admitting: *Deleted

## 2018-12-01 ENCOUNTER — Other Ambulatory Visit: Payer: Self-pay

## 2018-12-01 DIAGNOSIS — O0990 Supervision of high risk pregnancy, unspecified, unspecified trimester: Secondary | ICD-10-CM | POA: Insufficient documentation

## 2018-12-01 DIAGNOSIS — I10 Essential (primary) hypertension: Secondary | ICD-10-CM | POA: Insufficient documentation

## 2018-12-01 DIAGNOSIS — Z8632 Personal history of gestational diabetes: Secondary | ICD-10-CM

## 2018-12-01 MED ORDER — BLOOD PRESSURE KIT DEVI: 1.0000 | 0 refills | Status: AC | PRN

## 2018-12-01 NOTE — Progress Notes (Signed)
I connected with  Miranda Ruiz on 12/01/18 at  9:30 AM EDT by telephone and verified that I am speaking with the correct person using two identifiers.   I discussed the limitations, risks, security and privacy concerns of performing an evaluation and management service by telephone and the availability of in person appointments. I also discussed with the patient that there may be a patient responsible charge related to this service. The patient expressed understanding and agreed to proceed.  We discussed she is sure of LMP 09/04/18 and this makes her [redacted]w[redacted]d with EDD 06/11/19. Dakotah Orrego,RN Explained we will send her 59 app- explained I will contact babyscripts to send to her since she has had a recent pregnancy and she should get the email in the next few days. .  Explained we will send a blood pressure cuff to her and the pharmacy that will send it will call her to verify her address.  Explained we will have her take her blood pressure weekly and enter into the app. Explained she will have some visits in office and some virtually. She has MyChart App already for [facility]- I explained she should be able to [facility] and gave her the MyChart help desk to call . Reviewed appointment date/ time with her , our location and to wear mask, no visitors. Explained she will have exam, ob bloodwork if needed. She already had ob bloodwork and  hemoglobin a1C at [facility]. May have genetic testing if desired. Explained we will schedule an [name] at 19 weeks and she will get notification via [place]. She voices understanding.  12/01/2018  9:33 AM

## 2018-12-03 ENCOUNTER — Other Ambulatory Visit: Payer: Self-pay

## 2018-12-03 ENCOUNTER — Encounter: Payer: Self-pay | Admitting: *Deleted

## 2018-12-03 ENCOUNTER — Ambulatory Visit (INDEPENDENT_AMBULATORY_CARE_PROVIDER_SITE_OTHER): Payer: Medicaid - Out of State | Admitting: Obstetrics & Gynecology

## 2018-12-03 ENCOUNTER — Encounter: Payer: Self-pay | Admitting: Obstetrics & Gynecology

## 2018-12-03 DIAGNOSIS — O99211 Obesity complicating pregnancy, first trimester: Secondary | ICD-10-CM

## 2018-12-03 DIAGNOSIS — O0990 Supervision of high risk pregnancy, unspecified, unspecified trimester: Secondary | ICD-10-CM

## 2018-12-03 DIAGNOSIS — O0991 Supervision of high risk pregnancy, unspecified, first trimester: Secondary | ICD-10-CM

## 2018-12-03 DIAGNOSIS — Z8632 Personal history of gestational diabetes: Secondary | ICD-10-CM

## 2018-12-03 DIAGNOSIS — O09291 Supervision of pregnancy with other poor reproductive or obstetric history, first trimester: Secondary | ICD-10-CM

## 2018-12-03 DIAGNOSIS — Z3A13 13 weeks gestation of pregnancy: Secondary | ICD-10-CM

## 2018-12-03 MED ORDER — ASPIRIN EC 81 MG PO TBEC: 81.0000 mg | DELAYED_RELEASE_TABLET | Freq: Every day | ORAL | 2 refills | Status: DC

## 2018-12-03 NOTE — Patient Instructions (Signed)

## 2018-12-04 LAB — COMPREHENSIVE METABOLIC PANEL
ALT: 15 IU/L (ref 0–32)
AST: 14 IU/L (ref 0–40)
Albumin/Globulin Ratio: 1.6 (ref 1.2–2.2)
Albumin: 4.4 g/dL (ref 3.8–4.8)
Alkaline Phosphatase: 39 IU/L (ref 39–117)
BUN/Creatinine Ratio: 8 — ABNORMAL LOW (ref 9–23)
BUN: 6 mg/dL (ref 6–20)
Bilirubin Total: 0.3 mg/dL (ref 0.0–1.2)
CO2: 21 mmol/L (ref 20–29)
Calcium: 9.7 mg/dL (ref 8.7–10.2)
Chloride: 101 mmol/L (ref 96–106)
Creatinine, Ser: 0.73 mg/dL (ref 0.57–1.00)
GFR calc Af Amer: 122 mL/min/{1.73_m2} (ref >59)
GFR calc non Af Amer: 106 mL/min/{1.73_m2} (ref >59)
Globulin, Total: 2.8 g/dL (ref 1.5–4.5)
Glucose: 84 mg/dL (ref 65–99)
Potassium: 4.8 mmol/L (ref 3.5–5.2)
Sodium: 137 mmol/L (ref 134–144)
Total Protein: 7.2 g/dL (ref 6.0–8.5)

## 2018-12-04 LAB — PROTEIN / CREATININE RATIO, URINE
Creatinine, Urine: 95.9 mg/dL
Protein, Ur: 11.1 mg/dL
Protein/Creat Ratio: 116 mg/g{creat} (ref 0–200)

## 2018-12-04 NOTE — Progress Notes (Signed)
Dr. Roselie Awkward placed order for hemoglobin A1C.  Patient had already had other labs drawn and had left the office.  Crystal, phlebotomist, called patient on 12/04/18 and asked to return for lab draw.  Patient states she will call office on Monday once she knows her work schedule.  She will schedule appointment for lab draw only for hemoglobin A1C.

## 2018-12-04 NOTE — Progress Notes (Signed)
  Subjective:    Miranda Ruiz is a [id number] [redacted]w[redacted]d being seen today for her first obstetrical visit.  Her obstetrical history is significant for recent pregnancy, previous cesarean section,obesity. Patient does intend to breast feed. Pregnancy history fully reviewed.  Patient reports no complaints.  Vitals:   12/03/18 1558  BP: 116/81  Pulse: (!) 102  Temp: 98.2 F (36.8 C)  Weight: (!) 315 lb 11.2 oz (143.2 kg)    HISTORY: OB History  Gravida Para Term Preterm AB Living  3 2 2  0 0 2  SAB TAB Ectopic Multiple Live Births  0 0 0 0 2    # Outcome Date GA Lbr Len/2nd Weight Sex Delivery Anes PTL Lv  3 Current           2 Term 02/17/18 [redacted]w[redacted]d  7 lb 15.9 oz (3.626 kg) M CS-LTranv Spinal  LIV     Birth Comments: GDM,   1 Term 2008 [redacted]w[redacted]d  9 lb (4.082 kg) M CS-Unspec EPI  LIV     Birth Comments: IOL for GDM, c/s ftp,   Past Medical History:  Diagnosis Date  . Gestational diabetes   . Hypertension    postpartum after 2nd child   Past Surgical History:  Procedure Laterality Date  . APPENDECTOMY    . CESAREAN SECTION    . CESAREAN SECTION N/A 02/17/2018   Procedure: CESAREAN SECTION;  Surgeon: Truett Mainland, DO;  Location: [place];  Service: Obstetrics;  Laterality: N/A;   Family History  Problem Relation Age of Onset  . Cancer Maternal Aunt      Exam    Uterus:     Pelvic Exam:                               System: Breast:  normal appearance, no masses or tenderness   Skin: normal coloration and turgor, no rashes    Neurologic: oriented, normal mood   Extremities: normal strength, tone, and muscle mass   HEENT PERRLA, extra ocular movement intact and neck supple with midline trachea   Mouth/Teeth mucous membranes moist, pharynx normal without lesions and dental hygiene good   Neck supple   Cardiovascular: regular rate and rhythm   Respiratory:  appears well, vitals normal, no respiratory distress, acyanotic, normal RR   Abdomen: obese, no mass,  old CS scars, large pannous   Urinary:        Assessment:    Pregnancy: [id number] Patient Active Problem List   Diagnosis Date Noted  . Supervision of high risk pregnancy, antepartum 12/01/2018  . Hypertension   . Previous cesarean section 02/17/2018  . Prediabetes 10/24/2017  . H/O: C-section 09/15/2017  . History of gestational diabetes 09/15/2017  . Hirsutism 11/03/2015  . Morbid (severe) obesity due to excess calories ([place]) 11/03/2015        Plan:     Initial labs drawn. Prenatal vitamins. Problem list reviewed and updated. Genetic Screening discussed Panorama  Ultrasound discussed; fetal survey: ordered.  Follow up in 4 weeks. 50% of 30 min visit spent on counseling and coordination of care.  ASA daily, A1c ordered   Miranda Ruiz 12/04/2018

## 2018-12-05 LAB — CULTURE, OB URINE

## 2018-12-05 LAB — URINE CULTURE, OB REFLEX

## 2018-12-17 NOTE — Progress Notes (Unsigned)
[facility] sent communication that the patients panorama was being slightly delayed in the turnaround time. Received 12/05/2018

## 2018-12-18 ENCOUNTER — Encounter: Payer: Self-pay | Admitting: *Deleted

## 2018-12-21 ENCOUNTER — Telehealth: Payer: Self-pay | Admitting: Lactation Services

## 2018-12-21 NOTE — Telephone Encounter (Signed)
Pt called and LM on nurse voice mail wanting the results of her genetic testing.

## 2018-12-22 ENCOUNTER — Telehealth: Payer: Self-pay | Admitting: *Deleted

## 2018-12-22 NOTE — Telephone Encounter (Addendum)
Pt left message again today @ 1107 stating that she would like her Genetic Testing results.   1730 Called pt and informed her that we had received a fax from [facility] on 9/10 stating that there was a slight processing delay for these test results. They were not expecting and problems with the specimen and expected to have results in 2-3 days. Pt was advised to check her account online or to call them tomorrow for additional information. Pt voiced understanding.

## 2018-12-22 NOTE — Telephone Encounter (Signed)
Encounter opened in error

## 2018-12-25 ENCOUNTER — Telehealth: Payer: Self-pay | Admitting: Lactation Services

## 2018-12-25 NOTE — Telephone Encounter (Signed)
Pt was called after speaking with Cathlean Marseilles and was informed that her results are low risk and she is having a boy. Pt was excited and voiced understanding. Pt aware that results will be scanned into her chart next week.

## 2018-12-30 ENCOUNTER — Telehealth: Payer: Self-pay | Admitting: Obstetrics & Gynecology

## 2018-12-30 NOTE — Telephone Encounter (Signed)
Spoke with patient about her appointment on 9/24 @ 9:35. Patient instructed that the appointment is a mychart visit. Patient instructed to download the mychart app if not already done so. Patient verbalized she has the app downloaded. Patient instructed that a nurse will be calling her around her appointment time.

## 2018-12-31 ENCOUNTER — Encounter: Payer: Medicaid - Out of State | Admitting: Obstetrics & Gynecology

## 2018-12-31 ENCOUNTER — Other Ambulatory Visit: Payer: Self-pay

## 2018-12-31 NOTE — Progress Notes (Signed)
Pt requested to reschedule appointment.

## 2019-01-14 ENCOUNTER — Encounter: Payer: Self-pay | Admitting: Obstetrics & Gynecology

## 2019-01-14 ENCOUNTER — Telehealth (INDEPENDENT_AMBULATORY_CARE_PROVIDER_SITE_OTHER): Payer: Medicaid - Out of State | Admitting: Obstetrics & Gynecology

## 2019-01-14 ENCOUNTER — Other Ambulatory Visit: Payer: Self-pay

## 2019-01-14 DIAGNOSIS — Z98891 History of uterine scar from previous surgery: Secondary | ICD-10-CM

## 2019-01-14 DIAGNOSIS — Z8632 Personal history of gestational diabetes: Secondary | ICD-10-CM

## 2019-01-14 DIAGNOSIS — I10 Essential (primary) hypertension: Secondary | ICD-10-CM

## 2019-01-14 DIAGNOSIS — Z3A18 18 weeks gestation of pregnancy: Secondary | ICD-10-CM

## 2019-01-14 DIAGNOSIS — O0990 Supervision of high risk pregnancy, unspecified, unspecified trimester: Secondary | ICD-10-CM

## 2019-01-14 DIAGNOSIS — O0992 Supervision of high risk pregnancy, unspecified, second trimester: Secondary | ICD-10-CM

## 2019-01-14 NOTE — Progress Notes (Signed)
I connected with  Miranda Ruiz on 01/14/19 at  2:55 PM EDT by telephone and verified that I am speaking with the correct person using two identifiers.   I discussed the limitations, risks, security and privacy concerns of performing an evaluation and management service by telephone and the availability of in person appointments. I also discussed with the patient that there may be a patient responsible charge related to this service. The patient expressed understanding and agreed to proceed.  Benson, CMA 01/14/2019  2:42 PM   occassional shortness of breath, states even when she's sitting still.  Still no blood pressure cuff but denies any headaches or symptoms. Will get one soon.  Has U/S Appointment tomorrow. Will get her bp taken then.

## 2019-01-14 NOTE — Progress Notes (Addendum)
   [place] VIRTUAL VIDEO VISIT ENCOUNTER NOTE  Provider location: Center for [employer] [facility] at [place]   I connected with Janyth Pupa on 01/14/19 at  2:55 PM EDT by MyChart Video Encounter at home and verified that I am speaking with the correct person using two identifiers.   I discussed the limitations, risks, security and privacy concerns of performing an evaluation and management service virtually and the availability of in person appointments. I also discussed with the patient that there may be a patient responsible charge related to this service. The patient expressed understanding and agreed to proceed. Subjective:  Miranda Ruiz is a 38 y.o. G3P2002 at [redacted]w[redacted]d being seen today for ongoing prenatal care.  She is currently monitored for the following issues for this high-risk pregnancy and has H/O: C-section; History of gestational diabetes; Prediabetes; Previous cesarean section; Hirsutism; Morbid (severe) obesity due to excess calories ([place]); Hypertension; and Supervision of high risk pregnancy, antepartum on their problem list.  Patient reports no complaints.  Contractions: Not present. Vag. Bleeding: None.  Movement: Present. Denies any leaking of fluid.   The following portions of the patient's history were reviewed and updated as appropriate: allergies, current medications, past family history, past medical history, past social history, past surgical history and problem list.   Objective:  There were no vitals filed for this visit.  Fetal Status:     Movement: Present     General:  Alert, oriented and cooperative. Patient is in no acute distress.  Respiratory: Normal respiratory effort, no problems with respiration noted  Mental Status: Normal mood and affect. Normal behavior. Normal judgment and thought content.  Rest of physical exam deferred due to type of encounter  Imaging: No results found.  Assessment and Plan:  Pregnancy: G3P2002 at [redacted]w[redacted]d 1.  Supervision of high risk pregnancy, antepartum Has [name] scheduled for tomorrow Needs AFP. Nursing will leave lab order slip at the MFM ofc on tomorrow and pt will get lab done locally.   2. Previous cesarean section For repeat at 39 weeks   3. Morbid (severe) obesity due to excess calories ([place])  4. History of gestational diabetes Screening test WNL  5. Essential hypertension Baby ASA  Preterm labor symptoms and general obstetric precautions including but not limited to vaginal bleeding, contractions, leaking of fluid and fetal movement were reviewed in detail with the patient. I discussed the assessment and treatment plan with the patient. The patient was provided an opportunity to ask questions and all were answered. The patient agreed with the plan and demonstrated an understanding of the instructions. The patient was advised to call back or seek an in-person office evaluation/go to MAU at [facility] for any urgent or concerning symptoms. Please refer to After Visit Summary for other counseling recommendations.   I provided 20 minutes of face-to-face time during this encounter.  No follow-ups on file.  Future Appointments  Date Time Provider [place]  01/15/2019  2:30 PM [place] [facility]  01/15/2019  2:30 PM [name] 5 WH-MFCUS M[facility]-US    Lavonia Drafts, [place] for [facility], [place]

## 2019-01-15 ENCOUNTER — Other Ambulatory Visit (HOSPITAL_COMMUNITY): Payer: Medicaid - Out of State

## 2019-01-15 ENCOUNTER — Ambulatory Visit (HOSPITAL_COMMUNITY): Payer: Medicaid - Out of State

## 2019-01-22 ENCOUNTER — Telehealth: Payer: Self-pay | Admitting: *Deleted

## 2019-01-22 NOTE — Telephone Encounter (Signed)
I discussed with Dr. Rosana Hoes and called Deborra Medina back and informed her at this time we do not recommend any other medications besides tylenol since she is only 20 weeks. She does recommend taking tylenol as needed- follow package recomendations; heating pad to back only for 20 minutes at a time ; then take off for 20 minutes. Also advised do not sleep with heating pad on .  Also position for comfort with pillows and get up and walk around every hour. Also advised to try these measures for a few days and if they don't help , call  us back. She voices understanding. Arthella Headings,RN

## 2019-01-22 NOTE — Telephone Encounter (Signed)
Miranda Ruiz called and left a voice message this am c/o severe lower back pain. 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 States she slept last night on her stomach and wants to know if there is a medicine like a muscle relaxant she can take for pain.  I called Miranda Ruiz back and she clarified she does not usually sleep on her stomach; just woke up that way. I advised not to sleep on her stomach, only on her side- preferably left side and may rotate sides for remainder of pregnancy. She confirms good fetal movement. She clarified she has had back pain before but this time started yesterday and got worse at day progressed. She took tylenol last night and was able to sleep ; but back is still hurting today. States it feels like a back strain =8,9. I explained she can continue taking tylenol as needed and that I will discuss with provider and call her back. She voices understanding. Miranda Chunn,RN

## 2019-01-28 ENCOUNTER — Other Ambulatory Visit: Payer: Medicaid - Out of State

## 2019-01-28 ENCOUNTER — Other Ambulatory Visit: Payer: Self-pay

## 2019-01-28 DIAGNOSIS — O0990 Supervision of high risk pregnancy, unspecified, unspecified trimester: Secondary | ICD-10-CM

## 2019-01-30 LAB — AFP, SERUM, OPEN SPINA BIFIDA
AFP MoM: 0.7
AFP Value: 28.5 ng/mL
Gest. Age on Collection Date: 20 weeks
Maternal Age At EDD: 38 yr
OSBR Risk 1 IN: 10000
Test Results:: NEGATIVE
Weight: 326 [lb_av]

## 2019-02-09 ENCOUNTER — Encounter: Payer: Self-pay | Admitting: *Deleted

## 2019-02-15 ENCOUNTER — Ambulatory Visit (INDEPENDENT_AMBULATORY_CARE_PROVIDER_SITE_OTHER): Payer: Medicaid - Out of State | Admitting: Family Medicine

## 2019-02-15 ENCOUNTER — Other Ambulatory Visit: Payer: Self-pay

## 2019-02-15 VITALS — BP 139/74 | HR 98 | Wt 331.3 lb

## 2019-02-15 DIAGNOSIS — Z98891 History of uterine scar from previous surgery: Secondary | ICD-10-CM

## 2019-02-15 DIAGNOSIS — O0992 Supervision of high risk pregnancy, unspecified, second trimester: Secondary | ICD-10-CM

## 2019-02-15 DIAGNOSIS — O0990 Supervision of high risk pregnancy, unspecified, unspecified trimester: Secondary | ICD-10-CM

## 2019-02-15 DIAGNOSIS — I10 Essential (primary) hypertension: Secondary | ICD-10-CM

## 2019-02-15 DIAGNOSIS — O10912 Unspecified pre-existing hypertension complicating pregnancy, second trimester: Secondary | ICD-10-CM

## 2019-02-15 DIAGNOSIS — Z8632 Personal history of gestational diabetes: Secondary | ICD-10-CM

## 2019-02-15 DIAGNOSIS — Z3A23 23 weeks gestation of pregnancy: Secondary | ICD-10-CM

## 2019-02-15 NOTE — Progress Notes (Signed)
   PRENATAL VISIT NOTE  Subjective:  Miranda Ruiz is a 38 y.o. G3P2002 at [redacted]w[redacted]d being seen today for ongoing prenatal care.  She is currently monitored for the following issues for this high-risk pregnancy and has H/O: C-section; History of gestational diabetes; Prediabetes; Previous cesarean section; Hirsutism; Morbid (severe) obesity due to excess calories ([place]); Hypertension; and Supervision of high risk pregnancy, antepartum on their problem list.  Patient reports no complaints.  Contractions: Not present. Vag. Bleeding: None.  Movement: Present. Denies leaking of fluid.   The following portions of the patient's history were reviewed and updated as appropriate: allergies, current medications, past family history, past medical history, past social history, past surgical history and problem list.   Objective:   Vitals:   02/15/19 1632  BP: 139/74  Pulse: 98  Weight: (!) 331 lb 4.8 oz (150.3 kg)    Fetal Status: Fetal Heart Rate (bpm): 144   Movement: Present     General:  Alert, oriented and cooperative. Patient is in no acute distress.  Skin: Skin is warm and dry. No rash noted.   Cardiovascular: Normal heart rate noted  Respiratory: Normal respiratory effort, no problems with respiration noted  Abdomen: Soft, gravid, appropriate for gestational age.  Pain/Pressure: Absent     Pelvic: Cervical exam deferred        Extremities: Normal range of motion.  Edema: None  Mental Status: Normal mood and affect. Normal behavior. Normal judgment and thought content.   Assessment and Plan:  Pregnancy: G3P2002 at [redacted]w[redacted]d 1. Supervision of high risk pregnancy, antepartum FHT and FH normal Return in 2 weeks for 2hr GTT  2. Previous cesarean section Rpt c/s at 39 weeks  3. Morbid (severe) obesity due to excess calories ([place])  4. Essential hypertension BP controlled ON ASA 81mg  Serial growth [name]  5. History of gestational diabetes Neg early testing  Preterm labor symptoms and general  obstetric precautions including but not limited to vaginal bleeding, contractions, leaking of fluid and fetal movement were reviewed in detail with the patient. Please refer to After Visit Summary for other counseling recommendations.   Return for HR OB f/u, In Office.  Future Appointments  Date Time Provider [place]  03/03/2019 11:00 AM WH-MFC [name] 3 WH-MFCUS MFC-US  03/03/2019 11:05 AM WH-MFC NURSE WH-MFC MFC-US    Truett Mainland, DO

## 2019-02-25 ENCOUNTER — Other Ambulatory Visit: Payer: Self-pay | Admitting: *Deleted

## 2019-02-25 DIAGNOSIS — O0990 Supervision of high risk pregnancy, unspecified, unspecified trimester: Secondary | ICD-10-CM

## 2019-03-03 ENCOUNTER — Other Ambulatory Visit (HOSPITAL_COMMUNITY): Payer: Self-pay | Admitting: *Deleted

## 2019-03-03 ENCOUNTER — Other Ambulatory Visit: Payer: Medicaid - Out of State

## 2019-03-03 ENCOUNTER — Ambulatory Visit (HOSPITAL_COMMUNITY): Payer: Medicaid - Out of State | Admitting: *Deleted

## 2019-03-03 ENCOUNTER — Other Ambulatory Visit: Payer: Self-pay

## 2019-03-03 ENCOUNTER — Ambulatory Visit (HOSPITAL_COMMUNITY)
Admission: RE | Admit: 2019-03-03 | Discharge: 2019-03-03 | Disposition: A | Discharge status: Home / Self Care | Payer: Medicaid - Out of State | Source: Ambulatory Visit | Attending: Obstetrics & Gynecology | Admitting: Obstetrics & Gynecology

## 2019-03-03 DIAGNOSIS — O0990 Supervision of high risk pregnancy, unspecified, unspecified trimester: Secondary | ICD-10-CM | POA: Diagnosis present

## 2019-03-03 DIAGNOSIS — Z8632 Personal history of gestational diabetes: Secondary | ICD-10-CM | POA: Insufficient documentation

## 2019-03-03 DIAGNOSIS — O10919 Unspecified pre-existing hypertension complicating pregnancy, unspecified trimester: Secondary | ICD-10-CM

## 2019-03-03 DIAGNOSIS — O09522 Supervision of elderly multigravida, second trimester: Secondary | ICD-10-CM

## 2019-03-03 DIAGNOSIS — Z3A24 24 weeks gestation of pregnancy: Secondary | ICD-10-CM

## 2019-03-03 DIAGNOSIS — O99212 Obesity complicating pregnancy, second trimester: Secondary | ICD-10-CM

## 2019-03-03 DIAGNOSIS — I10 Essential (primary) hypertension: Secondary | ICD-10-CM | POA: Insufficient documentation

## 2019-03-03 DIAGNOSIS — O162 Unspecified maternal hypertension, second trimester: Secondary | ICD-10-CM

## 2019-03-03 DIAGNOSIS — O09292 Supervision of pregnancy with other poor reproductive or obstetric history, second trimester: Secondary | ICD-10-CM

## 2019-03-03 DIAGNOSIS — O34219 Maternal care for unspecified type scar from previous cesarean delivery: Secondary | ICD-10-CM

## 2019-03-10 ENCOUNTER — Other Ambulatory Visit: Payer: Medicaid - Out of State

## 2019-03-15 ENCOUNTER — Encounter: Payer: Medicaid - Out of State | Admitting: Family Medicine

## 2019-03-22 ENCOUNTER — Ambulatory Visit (INDEPENDENT_AMBULATORY_CARE_PROVIDER_SITE_OTHER): Payer: PRIVATE HEALTH INSURANCE | Admitting: Family Medicine

## 2019-03-22 ENCOUNTER — Other Ambulatory Visit: Payer: Medicaid - Out of State

## 2019-03-22 ENCOUNTER — Other Ambulatory Visit: Payer: Self-pay

## 2019-03-22 VITALS — BP 124/63 | HR 97 | Wt 340.4 lb

## 2019-03-22 DIAGNOSIS — Z3A27 27 weeks gestation of pregnancy: Secondary | ICD-10-CM

## 2019-03-22 DIAGNOSIS — Z23 Encounter for immunization: Secondary | ICD-10-CM | POA: Diagnosis not present

## 2019-03-22 DIAGNOSIS — O0990 Supervision of high risk pregnancy, unspecified, unspecified trimester: Secondary | ICD-10-CM

## 2019-03-22 DIAGNOSIS — I10 Essential (primary) hypertension: Secondary | ICD-10-CM

## 2019-03-22 DIAGNOSIS — Z98891 History of uterine scar from previous surgery: Secondary | ICD-10-CM

## 2019-03-22 DIAGNOSIS — O0992 Supervision of high risk pregnancy, unspecified, second trimester: Secondary | ICD-10-CM

## 2019-03-22 DIAGNOSIS — Z8632 Personal history of gestational diabetes: Secondary | ICD-10-CM

## 2019-03-22 NOTE — Progress Notes (Addendum)
   PRENATAL VISIT NOTE  Subjective:  Miranda Ruiz is a 38 y.o. G3P2002 at [redacted]w[redacted]d being seen today for ongoing prenatal care.  She is currently monitored for the following issues for this high-risk pregnancy and has H/O: C-section; History of gestational diabetes; Prediabetes; Previous cesarean section; Hirsutism; Morbid (severe) obesity due to excess calories ([place]); Hypertension; and Supervision of high risk pregnancy, antepartum on their problem list.  Patient reports no complaints.  Contractions: Not present. Vag. Bleeding: None.  Movement: Present. Denies leaking of fluid.   The following portions of the patient's history were reviewed and updated as appropriate: allergies, current medications, past family history, past medical history, past social history, past surgical history and problem list.   Objective:   Vitals:   03/22/19 1056  BP: 124/63  Pulse: 97  Weight: (!) 340 lb 6.4 oz (154.4 kg)    Fetal Status: Fetal Heart Rate (bpm):  147 Fundal Height: 31 cm Movement: Present     General:  Alert, oriented and cooperative. Patient is in no acute distress.  Skin: Skin is warm and dry. No rash noted.   Cardiovascular: Normal heart rate noted  Respiratory: Normal respiratory effort, no problems with respiration noted  Abdomen: Soft, gravid, appropriate for gestational age.  Pain/Pressure: Absent     Pelvic: Cervical exam deferred        Extremities: Normal range of motion.  Edema: Trace  Mental Status: Normal mood and affect. Normal behavior. Normal judgment and thought content.   Assessment and Plan:  Pregnancy: G3P2002 at [redacted]w[redacted]d 1. Supervision of high risk pregnancy, antepartum Next [name] 12/23 to complete growth FH size > dates; likely due to obesity and difficulty measuring; already has f/u scan as above (EFW 79% at 24w) Tdap today   2. Previous cesarean section Repeat C/S to be scheduled at 39w  3. Essential hypertension Cont ASA BP at goal today; only had elevated blood  pressure after last pregnancy and took an anti-hypertensive for about 1 week  Has not been taking BP at home; reports she will pick one up; will have visits in person until then   4. History of gestational diabetes 28 week labs today High likelihood of GDMA2 as GDMA2 in both prior pregnancies  A1c 5.9 on 10/22/2018 (Care Everywhere)  5. Morbid (severe) obesity due to excess calories (HCC) BMI 58  Preterm labor symptoms and general obstetric precautions including but not limited to vaginal bleeding, contractions, leaking of fluid and fetal movement were reviewed in detail with the patient. Please refer to After Visit Summary for other counseling recommendations.   Return in about 3 weeks (around 04/12/2019) for [facility]; in-person.  Future Appointments  Date Time Provider [place]  03/31/2019 12:45 PM [place] MFC-US  03/31/2019 12:45 PM WH-MFC [name] 2 WH-MFCUS MFC-US    Chauncey Mann, MD

## 2019-03-23 LAB — CBC
Hematocrit: 32.1 % — ABNORMAL LOW (ref 34.0–46.6)
Hemoglobin: 10.9 g/dL — ABNORMAL LOW (ref 11.1–15.9)
MCH: 30.5 pg (ref 26.6–33.0)
MCHC: 34 g/dL (ref 31.5–35.7)
MCV: 90 fL (ref 79–97)
Platelets: 207 10*3/uL (ref 150–450)
RBC: 3.57 x10E6/uL — ABNORMAL LOW (ref 3.77–5.28)
RDW: 12 % (ref 11.7–15.4)
WBC: 5.6 10*3/uL (ref 3.4–10.8)

## 2019-03-23 LAB — HIV ANTIBODY (ROUTINE TESTING W REFLEX): HIV Screen 4th Generation wRfx: NONREACTIVE

## 2019-03-23 LAB — GLUCOSE TOLERANCE, 2 HOURS W/ 1HR
Glucose, 1 hour: 217 mg/dL — ABNORMAL HIGH (ref 65–179)
Glucose, 2 hour: 183 mg/dL — ABNORMAL HIGH (ref 65–152)
Glucose, Fasting: 108 mg/dL — ABNORMAL HIGH (ref 65–91)

## 2019-03-23 LAB — RPR: RPR Ser Ql: NONREACTIVE

## 2019-03-24 ENCOUNTER — Other Ambulatory Visit: Payer: Self-pay | Admitting: Family Medicine

## 2019-03-24 DIAGNOSIS — O24419 Gestational diabetes mellitus in pregnancy, unspecified control: Secondary | ICD-10-CM

## 2019-03-24 MED ORDER — METFORMIN HCL 500 MG PO TABS: 500.0000 mg | ORAL_TABLET | Freq: Every day | ORAL | 5 refills | Status: DC

## 2019-03-25 ENCOUNTER — Telehealth: Payer: Self-pay | Admitting: Lactation Services

## 2019-03-25 DIAGNOSIS — O0990 Supervision of high risk pregnancy, unspecified, unspecified trimester: Secondary | ICD-10-CM

## 2019-03-25 MED ORDER — ACCU-CHEK GUIDE W/DEVICE KIT: 1.0000 | PACK | Freq: Once | 0 refills | Status: AC

## 2019-03-25 MED ORDER — ACCU-CHEK SOFTCLIX LANCETS MISC: 12 refills | Status: DC

## 2019-03-25 MED ORDER — ACCU-CHEK GUIDE VI STRP: ORAL_STRIP | 12 refills | Status: DC

## 2019-03-25 NOTE — Telephone Encounter (Signed)
Called pt to let her know that her Glucose Tolerance testing was abnormal. Glucometer and supplies ordered. Pt has had GDM in the past. Pt is unsure what product is covered by her [employer] and she has [employer]. She is to let us know if her supplies are not covered or she finds out about a brand that is covered.   Reviewed with pt that she will be meeting with Diabetes Education and the front office will be calling to schedule with pt.   Pt was informed to check her blood sugars fasting and 2 hours after the first bite of each main meal and record them in [employer]. Pt is unsure if she is on [employer]. Asked her to please check her email and sign up and once signed up let us know so we can add her to the Glucose Management portion. Pt voiced understanding.   Pt was found to be in [employer] and was added to Glucose Management. Pt was informed to please sign in for Glucose Management when she receives the request.   Pt reports she does not have a BP cuff at this time.

## 2019-03-25 NOTE — Telephone Encounter (Signed)
-----   Message from Chauncey Mann, MD sent at 03/24/2019  7:52 PM EST ----- Can we get this patient set up with diabetic education and testing strips? Will start on Metformin 500 mg qhs for now and will likely need to titrate and possibly to insulin. Patient notified via [place].

## 2019-03-31 ENCOUNTER — Other Ambulatory Visit: Payer: Self-pay

## 2019-03-31 ENCOUNTER — Other Ambulatory Visit (HOSPITAL_COMMUNITY): Payer: Self-pay | Admitting: *Deleted

## 2019-03-31 ENCOUNTER — Ambulatory Visit (HOSPITAL_COMMUNITY): Payer: Medicaid - Out of State | Admitting: *Deleted

## 2019-03-31 ENCOUNTER — Encounter (HOSPITAL_COMMUNITY): Payer: Self-pay

## 2019-03-31 ENCOUNTER — Ambulatory Visit (HOSPITAL_COMMUNITY)
Admission: RE | Admit: 2019-03-31 | Discharge: 2019-03-31 | Disposition: A | Discharge status: Home / Self Care | Payer: Medicaid - Out of State | Source: Ambulatory Visit | Attending: Obstetrics and Gynecology | Admitting: Obstetrics and Gynecology

## 2019-03-31 DIAGNOSIS — O09523 Supervision of elderly multigravida, third trimester: Secondary | ICD-10-CM

## 2019-03-31 DIAGNOSIS — Z3A28 28 weeks gestation of pregnancy: Secondary | ICD-10-CM

## 2019-03-31 DIAGNOSIS — O0990 Supervision of high risk pregnancy, unspecified, unspecified trimester: Secondary | ICD-10-CM | POA: Diagnosis present

## 2019-03-31 DIAGNOSIS — O09293 Supervision of pregnancy with other poor reproductive or obstetric history, third trimester: Secondary | ICD-10-CM | POA: Diagnosis not present

## 2019-03-31 DIAGNOSIS — Z362 Encounter for other antenatal screening follow-up: Secondary | ICD-10-CM

## 2019-03-31 DIAGNOSIS — O10919 Unspecified pre-existing hypertension complicating pregnancy, unspecified trimester: Secondary | ICD-10-CM | POA: Diagnosis not present

## 2019-03-31 DIAGNOSIS — O163 Unspecified maternal hypertension, third trimester: Secondary | ICD-10-CM

## 2019-03-31 DIAGNOSIS — O99213 Obesity complicating pregnancy, third trimester: Secondary | ICD-10-CM

## 2019-03-31 DIAGNOSIS — O34219 Maternal care for unspecified type scar from previous cesarean delivery: Secondary | ICD-10-CM

## 2019-03-31 DIAGNOSIS — O09529 Supervision of elderly multigravida, unspecified trimester: Secondary | ICD-10-CM

## 2019-04-13 ENCOUNTER — Telehealth: Payer: Self-pay | Admitting: Family Medicine

## 2019-04-13 ENCOUNTER — Other Ambulatory Visit: Payer: Medicaid - Out of State

## 2019-04-13 NOTE — Telephone Encounter (Signed)
The patient called in to reschedule the missed appointment from yesterday and also to reschedule the upcoming appointment for tomorrow as she stated she lives out of town and she cant just come back and forth for different appointments on different days. She needs all of her appointment on the same days. Informed the patient of the the scheduling and timeframe the MD would like her back in office. The patient stated she would still like her appointment on the same day. Scheduled the upcoming with the u/s appointments. The patient verbalized agreement to the new date and time.

## 2019-04-14 ENCOUNTER — Encounter: Payer: Medicaid - Out of State | Admitting: Obstetrics and Gynecology

## 2019-04-19 ENCOUNTER — Telehealth: Payer: Self-pay | Admitting: Student

## 2019-04-19 ENCOUNTER — Telehealth: Payer: Self-pay | Admitting: *Deleted

## 2019-04-19 ENCOUNTER — Encounter: Payer: Self-pay | Admitting: *Deleted

## 2019-04-19 DIAGNOSIS — O24410 Gestational diabetes mellitus in pregnancy, diet controlled: Secondary | ICD-10-CM

## 2019-04-19 NOTE — Telephone Encounter (Addendum)
-----   Message from Reva Bores, MD sent at 04/16/2019  1:52 PM EST ----- Is she using BS for DM? Can we trouble shoot--no values.  1/11  1000 Called pt and inquired about her blood sugar values as they are not entered in Babyscripts. Pt stated that she has not yet received a glucose meter and therefore is unable to check CBG's. She stated there is a problem with her insurance. Pt stated she was @ work right now and would need to speak to [name] later. I advised pt that I will send a MyChart message and asked her to respond with further details so that we can determine how to help remedy the problem. Pt voiced understanding and agreed.   1/11 1200  Pt returned message via MyChart. She will contact [employer] [place] today and inquire about glucose meter. She will send a message with updated information.

## 2019-04-19 NOTE — Telephone Encounter (Signed)
Called the patient and informed of the added upcoming appointment on 04/29/2019 for an NST. The patient verbalized understanding.

## 2019-04-20 MED ORDER — FREESTYLE LANCETS MISC: 12 refills | Status: DC

## 2019-04-20 MED ORDER — FREESTYLE TEST VI STRP: ORAL_STRIP | 12 refills | Status: DC

## 2019-04-20 MED ORDER — FREESTYLE SYSTEM KIT: 1.0000 | PACK | 0 refills | Status: DC | PRN

## 2019-04-29 ENCOUNTER — Encounter (HOSPITAL_COMMUNITY): Payer: Self-pay

## 2019-04-29 ENCOUNTER — Encounter: Payer: Medicaid - Out of State | Attending: Obstetrics and Gynecology | Admitting: Registered"

## 2019-04-29 ENCOUNTER — Ambulatory Visit (HOSPITAL_COMMUNITY): Payer: Medicaid - Out of State | Admitting: *Deleted

## 2019-04-29 ENCOUNTER — Other Ambulatory Visit: Payer: Self-pay

## 2019-04-29 ENCOUNTER — Ambulatory Visit (INDEPENDENT_AMBULATORY_CARE_PROVIDER_SITE_OTHER): Payer: PRIVATE HEALTH INSURANCE | Admitting: *Deleted

## 2019-04-29 ENCOUNTER — Ambulatory Visit: Payer: Medicaid - Out of State | Admitting: Registered"

## 2019-04-29 ENCOUNTER — Ambulatory Visit (INDEPENDENT_AMBULATORY_CARE_PROVIDER_SITE_OTHER): Payer: PRIVATE HEALTH INSURANCE | Admitting: Obstetrics & Gynecology

## 2019-04-29 ENCOUNTER — Ambulatory Visit (HOSPITAL_COMMUNITY)
Admission: RE | Admit: 2019-04-29 | Discharge: 2019-04-29 | Disposition: A | Discharge status: Home / Self Care | Payer: Medicaid - Out of State | Source: Ambulatory Visit | Attending: Obstetrics and Gynecology | Admitting: Obstetrics and Gynecology

## 2019-04-29 VITALS — BP 123/78 | HR 100 | Wt 338.0 lb

## 2019-04-29 VITALS — BP 123/78 | HR 100 | Wt 338.9 lb

## 2019-04-29 DIAGNOSIS — Z362 Encounter for other antenatal screening follow-up: Secondary | ICD-10-CM

## 2019-04-29 DIAGNOSIS — O0990 Supervision of high risk pregnancy, unspecified, unspecified trimester: Secondary | ICD-10-CM | POA: Diagnosis present

## 2019-04-29 DIAGNOSIS — Z3A34 34 weeks gestation of pregnancy: Secondary | ICD-10-CM

## 2019-04-29 DIAGNOSIS — O09293 Supervision of pregnancy with other poor reproductive or obstetric history, third trimester: Secondary | ICD-10-CM | POA: Diagnosis not present

## 2019-04-29 DIAGNOSIS — Z3A33 33 weeks gestation of pregnancy: Secondary | ICD-10-CM

## 2019-04-29 DIAGNOSIS — Z3A32 32 weeks gestation of pregnancy: Secondary | ICD-10-CM

## 2019-04-29 DIAGNOSIS — O99213 Obesity complicating pregnancy, third trimester: Secondary | ICD-10-CM

## 2019-04-29 DIAGNOSIS — O0993 Supervision of high risk pregnancy, unspecified, third trimester: Secondary | ICD-10-CM | POA: Diagnosis not present

## 2019-04-29 DIAGNOSIS — O163 Unspecified maternal hypertension, third trimester: Secondary | ICD-10-CM

## 2019-04-29 DIAGNOSIS — Z3A00 Weeks of gestation of pregnancy not specified: Secondary | ICD-10-CM | POA: Diagnosis not present

## 2019-04-29 DIAGNOSIS — O09529 Supervision of elderly multigravida, unspecified trimester: Secondary | ICD-10-CM | POA: Insufficient documentation

## 2019-04-29 DIAGNOSIS — O24419 Gestational diabetes mellitus in pregnancy, unspecified control: Secondary | ICD-10-CM

## 2019-04-29 DIAGNOSIS — Z98891 History of uterine scar from previous surgery: Secondary | ICD-10-CM

## 2019-04-29 DIAGNOSIS — I10 Essential (primary) hypertension: Secondary | ICD-10-CM

## 2019-04-29 DIAGNOSIS — O34219 Maternal care for unspecified type scar from previous cesarean delivery: Secondary | ICD-10-CM

## 2019-04-29 DIAGNOSIS — O24410 Gestational diabetes mellitus in pregnancy, diet controlled: Secondary | ICD-10-CM | POA: Diagnosis not present

## 2019-04-29 DIAGNOSIS — O09523 Supervision of elderly multigravida, third trimester: Secondary | ICD-10-CM

## 2019-04-29 NOTE — Progress Notes (Signed)
Patient was seen on 04/29/19 for Gestational Diabetes self-management. EDD 06/18/19. Patient states history of GDM controlled with insulin. Diet history obtained, patient states she often skips breakfast and sometimes lunch d/t busy schedule with her 2 children and work. Pt states she feels part of her high blood sugar comes from eating larger meals.  Because FBG appears to be close to target range, patient may be able to control PPBG with dietary changes. Patient didn't take Metformin last night because blood sugar was 111. RD discussed action of metformin and encouraged to take as directed.  Discussed strategies for easy snacks/protein shakes when doesn't have time to eat meals to prevent large meals. Also discussed concentrated sweets (syrup, juice), portion sizes and alternatives.   Plan:  Baby Scripts:  Patient uses [employer] and plans to record BG electronically Take medication if directed by MD  Patient already has a meter: Freestyle lite And is testing pre breakfast and 2 hours each meal as directed by MD Review of Log Book shows: FBG: 86, 92, 99, 114 2 hrs PPG: 144-187 mg/dL  Patient instructed to test pre breakfast and 2 hours each meal as directed by MD  Patient instructed to monitor glucose levels: FBS: 60 - 95 mg/dl 2 hour: <462 mg/dl  Patient received the following handouts:  Nutrition Diabetes and Pregnancy  Carbohydrate Counting List  Patient will be seen for follow-up as needed.

## 2019-04-29 NOTE — Patient Instructions (Signed)
Gestational Diabetes Mellitus, Diagnosis Gestational diabetes (gestational diabetes mellitus) is a short-term (temporary) form of diabetes that can happen during pregnancy. It goes away after you give birth. It may be caused by one or both of these problems:  Your pancreas does not make enough of a hormone called insulin.  Your body does not respond in a normal way to insulin that it makes. Insulin lets sugars (glucose) go into cells in the body. This gives you energy. If you have diabetes, sugars cannot get into cells. This causes high blood sugar (hyperglycemia). If you get gestational diabetes, you are:  More likely to get it if you get pregnant again.  More likely to develop type 2 diabetes in the future. If gestational diabetes is treated, it may not hurt you or your baby. Your doctor will set treatment goals for you. In general, you should have these blood sugar levels:  After not eating for a long time (fasting): 95 mg/dL (5.3 mmol/L).  After meals (postprandial): ? One hour after a meal: at or below 140 mg/dL (7.8 mmol/L). ? Two hours after a meal: at or below 120 mg/dL (6.7 mmol/L).  A1c (hemoglobin A1c) level: 6-6.5%. Follow these instructions at home: Questions to ask your doctor   You may want to ask these questions: ? Do I need to meet with a diabetes educator? ? What equipment will I need to care for myself at home? ? What medicines do I need? When should I take them? ? How often do I need to check my blood sugar? ? What number can I call if I have questions? ? When is my next doctor's visit? General instructions  Take over-the-counter and prescription medicines only as told by your doctor.  Stay at a healthy weight during pregnancy.  Keep all follow-up visits as told by your doctor. This is important. Contact a doctor if:  Your blood sugar is at or above 240 mg/dL (13.3 mmol/L).  Your blood sugar is at or above 200 mg/dL (11.1 mmol/L) and you have ketones in  your pee (urine).  You have been sick or have had a fever for 2 days or more and you are not getting better.  You have any of these problems for more than 6 hours: ? You cannot eat or drink. ? You feel sick to your stomach (nauseous). ? You throw up (vomit). ? You have watery poop (diarrhea). Get help right away if:  Your blood sugar is lower than 54 mg/dL (3 mmol/L).  You get confused.  You have trouble: ? Thinking clearly. ? Breathing.  Your baby moves less than normal.  You have any of these: ? Moderate or large ketone levels in your pee. ? Blood coming from your vagina. ? Unusual fluid coming from your vagina. ? Early contractions. These may feel like tightness in your belly. Summary  Gestational diabetes is a short-term form of diabetes. It can happen while you are pregnant. It goes away after you give birth.  If gestational diabetes is treated, it may not hurt you or your baby. Your doctor will set treatment goals for you.  Keep all follow-up visits as told by your doctor. This is important. This information is not intended to replace advice given to you by your health care provider. Make sure you discuss any questions you have with your health care provider. Document Revised: 05/01/2017 Document Reviewed: 04/28/2015 Elsevier Patient Education  2020 Elsevier Inc.  

## 2019-04-29 NOTE — Progress Notes (Signed)
   PRENATAL VISIT NOTE  Subjective:  Miranda Ruiz is a 39 y.o. G3P2002 at [redacted]w[redacted]d being seen today for ongoing prenatal care.  She is currently monitored for the following issues for this high-risk pregnancy and has H/O: C-section; Gestational diabetes mellitus (GDM) affecting pregnancy, antepartum; Prediabetes; Previous cesarean section; Hirsutism; Morbid (severe) obesity due to excess calories (HCC); Hypertension; Supervision of high risk pregnancy, antepartum; and Chronic hypertension in pregnancy on their problem list.  Patient reports backache.  Contractions: Irregular. Vag. Bleeding: None.  Movement: Present. Denies leaking of fluid.   The following portions of the patient's history were reviewed and updated as appropriate: allergies, current medications, past family history, past medical history, past social history, past surgical history and problem list.   Objective:   Vitals:   04/29/19 1410  BP: 123/78  Pulse: 100  Weight: (!) 338 lb (153.3 kg)    Fetal Status: Fetal Heart Rate (bpm): nst   Movement: Present     General:  Alert, oriented and cooperative. Patient is in no acute distress.  Skin: Skin is warm and dry. No rash noted.   Cardiovascular: Normal heart rate noted  Respiratory: Normal respiratory effort, no problems with respiration noted  Abdomen: Soft, gravid, appropriate for gestational age.  Pain/Pressure: Absent     Pelvic: Cervical exam deferred        Extremities: Normal range of motion.  Edema: Trace  Mental Status: Normal mood and affect. Normal behavior. Normal judgment and thought content.   Assessment and Plan:  Pregnancy: G3P2002 at [redacted]w[redacted]d 1. Gestational diabetes mellitus (GDM) affecting pregnancy, antepartum Metformin. Has just started testing and has had problems getting readings. [name] is scheduled. Dating revised based on sure LMP documented 10/22/18  Preterm labor symptoms and general obstetric precautions including but not limited to vaginal bleeding,  contractions, leaking of fluid and fetal movement were reviewed in detail with the patient. Please refer to After Visit Summary for other counseling recommendations.   Return in about 1 week (around 05/06/2019) for virtual.  Future Appointments  Date Time Provider Department Center  04/29/2019  3:45 PM WH-MFC NURSE WH-MFC MFC-US  04/29/2019  3:45 PM WH-MFC [name] 2 WH-MFCUS MFC-US  05/06/2019 10:15 AM Constant, Gigi Gin, MD WOC-WOCA WOC  05/06/2019  3:45 PM WH-MFC NURSE WH-MFC MFC-US  05/06/2019  3:45 PM WH-MFC [name] 2 WH-MFCUS MFC-US  05/13/2019  3:45 PM WH-MFC NURSE WH-MFC MFC-US  05/13/2019  3:45 PM WH-MFC [name] 2 WH-MFCUS MFC-US    Scheryl Darter, MD

## 2019-04-29 NOTE — Progress Notes (Signed)
Pt states she has not been checking BP due to she did not purchase BP cuff. She denies H/A or visual disturbances. She reports that she does not have CHTN - only had elevated BP following delivery of her baby.  Pt has appt w/Diabetes educator today and also has [name] growth/BPP @ MFM today. Weekly BPP's have been scheduled @ M[facility]

## 2019-04-30 ENCOUNTER — Encounter: Payer: Self-pay | Admitting: Obstetrics and Gynecology

## 2019-04-30 NOTE — Progress Notes (Signed)
Reviewed data from Babyscripts, patient has not been logging CBGs 4x per day. Per provider note from yesterday, patient has had trouble getting readings from glucometer. Started on metformin.   Baldemar Lenis, M.D. Attending [facility])

## 2019-05-06 ENCOUNTER — Ambulatory Visit (HOSPITAL_COMMUNITY): Payer: Medicaid - Out of State

## 2019-05-06 ENCOUNTER — Telehealth: Payer: Medicaid - Out of State | Admitting: Obstetrics and Gynecology

## 2019-05-07 ENCOUNTER — Encounter: Payer: Self-pay | Admitting: Family Medicine

## 2019-05-13 ENCOUNTER — Ambulatory Visit (HOSPITAL_COMMUNITY): Payer: PRIVATE HEALTH INSURANCE | Admitting: *Deleted

## 2019-05-13 ENCOUNTER — Encounter: Payer: Self-pay | Admitting: Obstetrics and Gynecology

## 2019-05-13 ENCOUNTER — Encounter (HOSPITAL_COMMUNITY): Payer: Self-pay

## 2019-05-13 ENCOUNTER — Other Ambulatory Visit: Payer: Self-pay

## 2019-05-13 ENCOUNTER — Ambulatory Visit (HOSPITAL_COMMUNITY)
Admission: RE | Admit: 2019-05-13 | Discharge: 2019-05-13 | Disposition: A | Discharge status: Home / Self Care | Payer: PRIVATE HEALTH INSURANCE | Source: Ambulatory Visit | Attending: Obstetrics and Gynecology | Admitting: Obstetrics and Gynecology

## 2019-05-13 ENCOUNTER — Telehealth (INDEPENDENT_AMBULATORY_CARE_PROVIDER_SITE_OTHER): Payer: Medicaid - Out of State | Admitting: Obstetrics and Gynecology

## 2019-05-13 DIAGNOSIS — O34219 Maternal care for unspecified type scar from previous cesarean delivery: Secondary | ICD-10-CM

## 2019-05-13 DIAGNOSIS — Z98891 History of uterine scar from previous surgery: Secondary | ICD-10-CM

## 2019-05-13 DIAGNOSIS — O10013 Pre-existing essential hypertension complicating pregnancy, third trimester: Secondary | ICD-10-CM

## 2019-05-13 DIAGNOSIS — O24419 Gestational diabetes mellitus in pregnancy, unspecified control: Secondary | ICD-10-CM

## 2019-05-13 DIAGNOSIS — Z3A35 35 weeks gestation of pregnancy: Secondary | ICD-10-CM

## 2019-05-13 DIAGNOSIS — O0990 Supervision of high risk pregnancy, unspecified, unspecified trimester: Secondary | ICD-10-CM

## 2019-05-13 DIAGNOSIS — O09529 Supervision of elderly multigravida, unspecified trimester: Secondary | ICD-10-CM

## 2019-05-13 DIAGNOSIS — O09523 Supervision of elderly multigravida, third trimester: Secondary | ICD-10-CM | POA: Diagnosis not present

## 2019-05-13 DIAGNOSIS — O10919 Unspecified pre-existing hypertension complicating pregnancy, unspecified trimester: Secondary | ICD-10-CM

## 2019-05-13 DIAGNOSIS — O24415 Gestational diabetes mellitus in pregnancy, controlled by oral hypoglycemic drugs: Secondary | ICD-10-CM

## 2019-05-13 DIAGNOSIS — O99213 Obesity complicating pregnancy, third trimester: Secondary | ICD-10-CM

## 2019-05-13 NOTE — Progress Notes (Signed)
I connected with  Miranda Ruiz on 05/13/19 at  1:55 PM EST by MyChart and verified that I am speaking with the correct person using two identifiers.   I discussed the limitations, risks, security and privacy concerns of performing an evaluation and management service by telephone and the availability of in person appointments. I also discussed with the patient that there may be a patient responsible charge related to this service. The patient expressed understanding and agreed to proceed.  Pt does not have BP cuff at home. Denies blurry vision or dizziness, endorses frequent headaches. Last headache was this AM, has now resolved. Explained the importance of monitoring symptoms of htn and checking BP during pregnancy. Pt states she will buy a BP cuff today and notify the office of any headache that does not resolve with tylenol or rest.      [name], RN 05/13/2019  1:56 PM

## 2019-05-13 NOTE — Progress Notes (Signed)
TELEHEALTH OBSTETRICS PRENATAL VIRTUAL VIDEO VISIT ENCOUNTER NOTE  Provider location: [facility] at [place]   I connected with [name] on 05/13/19 at  1:55 PM EST by MyChart Video Encounter at home and verified that I am speaking with the correct person using two identifiers.   I discussed the limitations, risks, security and privacy concerns of performing an evaluation and management service virtually and the availability of in person appointments. I also discussed with the patient that there may be a patient responsible charge related to this service. The patient expressed understanding and agreed to proceed. Subjective:  Miranda Ruiz is a 39 y.o. G3P2002 at [redacted]w[redacted]d being seen today for ongoing prenatal care.  She is currently monitored for the following issues for this high-risk pregnancy and has H/O: C-section; Gestational diabetes mellitus (GDM) affecting pregnancy, antepartum; Prediabetes; Previous cesarean section; Hirsutism; Morbid (severe) obesity due to excess calories (HCC); Hypertension; Supervision of high risk pregnancy, antepartum; and Chronic hypertension in pregnancy on their problem list.  Patient reports no complaints.  Contractions: Irritability. Vag. Bleeding: None.  Movement: Present. Denies any leaking of fluid.   The following portions of the patient's history were reviewed and updated as appropriate: allergies, current medications, past family history, past medical history, past social history, past surgical history and problem list.   Objective:  There were no vitals filed for this visit.  Fetal Status:     Movement: Present     General:  Alert, oriented and cooperative. Patient is in no acute distress.  Respiratory: Normal respiratory effort, no problems with respiration noted  Mental Status: Normal mood and affect. Normal behavior. Normal judgment and thought content.  Rest of physical exam deferred due to type of  encounter  Imaging:  Assessment and Plan:  Pregnancy: G3P2002 at [redacted]w[redacted]d  1. Supervision of high risk pregnancy, antepartum Considering IUD  2. Chronic hypertension in pregnancy Cont baby ASA Stable, no meds  3. Gestational diabetes mellitus (GDM) affecting pregnancy, antepartum Started metformin 500 mg QHS last visit  Had trouble with readings at first FG: 80-100s Not checking her post breakfast or lunch due to being busy, not really checking dinner  - will increase metformin 1000 mg QHS - encouraged her to check CBGs as much possible, reviewed neonatal and maternal risks of uncontrolled DM - has BPP later today  4. H/O: C-section RCS scheduled for 06/04/19  5. Morbid (severe) obesity due to excess calories (HCC)   Preterm labor symptoms and general obstetric precautions including but not limited to vaginal bleeding, contractions, leaking of fluid and fetal movement were reviewed in detail with the patient. I discussed the assessment and treatment plan with the patient. The patient was provided an opportunity to ask questions and all were answered. The patient agreed with the plan and demonstrated an understanding of the instructions. The patient was advised to call back or seek an in-person office evaluation/go to [facility] at [facility] for any urgent or concerning symptoms. Please refer to After Visit Summary for other counseling recommendations.   I provided 15 minutes of face-to-face time during this encounter.  Return in about 1 week (around 05/20/2019) for high OB, in person, 36 week swabs.  Future Appointments  Date Time Provider Department Center  05/13/2019  3:45 PM WH-MFC NURSE WH-MFC MFC-US  05/13/2019  3:45 PM WH-MFC [name] 2 WH-MFCUS MFC-US  05/20/2019  3:45 PM WH-MFC NURSE WH-MFC MFC-US  05/20/2019  3:45 PM WH-MFC [name] 5 WH-MFCUS MFC-US  Sloan Leiter, MD Center for [place], [place]

## 2019-05-14 ENCOUNTER — Other Ambulatory Visit: Payer: Self-pay | Admitting: *Deleted

## 2019-05-14 DIAGNOSIS — O24419 Gestational diabetes mellitus in pregnancy, unspecified control: Secondary | ICD-10-CM

## 2019-05-17 ENCOUNTER — Other Ambulatory Visit: Payer: Self-pay

## 2019-05-20 ENCOUNTER — Encounter (HOSPITAL_COMMUNITY): Payer: Self-pay

## 2019-05-20 ENCOUNTER — Other Ambulatory Visit (HOSPITAL_COMMUNITY)
Admission: RE | Admit: 2019-05-20 | Discharge: 2019-05-20 | Disposition: A | Discharge status: Home / Self Care | Payer: PRIVATE HEALTH INSURANCE | Source: Ambulatory Visit | Attending: Obstetrics & Gynecology | Admitting: Obstetrics & Gynecology

## 2019-05-20 ENCOUNTER — Ambulatory Visit (HOSPITAL_BASED_OUTPATIENT_CLINIC_OR_DEPARTMENT_OTHER)
Admission: RE | Admit: 2019-05-20 | Discharge: 2019-05-20 | Disposition: A | Discharge status: Home / Self Care | Payer: PRIVATE HEALTH INSURANCE | Source: Ambulatory Visit | Attending: Obstetrics and Gynecology | Admitting: Obstetrics and Gynecology

## 2019-05-20 ENCOUNTER — Ambulatory Visit (INDEPENDENT_AMBULATORY_CARE_PROVIDER_SITE_OTHER): Payer: Medicaid - Out of State | Admitting: Obstetrics & Gynecology

## 2019-05-20 ENCOUNTER — Ambulatory Visit (HOSPITAL_COMMUNITY): Payer: PRIVATE HEALTH INSURANCE | Admitting: *Deleted

## 2019-05-20 ENCOUNTER — Telehealth (HOSPITAL_COMMUNITY): Payer: Self-pay | Admitting: *Deleted

## 2019-05-20 ENCOUNTER — Other Ambulatory Visit: Payer: Self-pay

## 2019-05-20 VITALS — BP 123/81 | HR 98 | Wt 338.0 lb

## 2019-05-20 VITALS — BP 125/58 | HR 105 | Temp 97.2°F

## 2019-05-20 DIAGNOSIS — O09529 Supervision of elderly multigravida, unspecified trimester: Secondary | ICD-10-CM

## 2019-05-20 DIAGNOSIS — O99213 Obesity complicating pregnancy, third trimester: Secondary | ICD-10-CM | POA: Diagnosis not present

## 2019-05-20 DIAGNOSIS — O24419 Gestational diabetes mellitus in pregnancy, unspecified control: Secondary | ICD-10-CM

## 2019-05-20 DIAGNOSIS — O09523 Supervision of elderly multigravida, third trimester: Secondary | ICD-10-CM | POA: Diagnosis not present

## 2019-05-20 DIAGNOSIS — O10919 Unspecified pre-existing hypertension complicating pregnancy, unspecified trimester: Secondary | ICD-10-CM | POA: Insufficient documentation

## 2019-05-20 DIAGNOSIS — O24415 Gestational diabetes mellitus in pregnancy, controlled by oral hypoglycemic drugs: Secondary | ICD-10-CM | POA: Diagnosis not present

## 2019-05-20 DIAGNOSIS — O0990 Supervision of high risk pregnancy, unspecified, unspecified trimester: Secondary | ICD-10-CM | POA: Insufficient documentation

## 2019-05-20 DIAGNOSIS — Z3A36 36 weeks gestation of pregnancy: Secondary | ICD-10-CM

## 2019-05-20 DIAGNOSIS — O0993 Supervision of high risk pregnancy, unspecified, third trimester: Secondary | ICD-10-CM

## 2019-05-20 NOTE — Telephone Encounter (Signed)
Preadmission screen Left voicemail asking pt to arrive at 0800 for her CS.  Requested she not take metformin tonight as well.

## 2019-05-20 NOTE — Progress Notes (Addendum)
PRENATAL VISIT NOTE  Subjective:  Miranda Ruiz is a 39 y.o. G3P2002 at [redacted]w[redacted]d being seen today for ongoing prenatal care.  She is currently monitored for the following issues for this high-risk pregnancy and has H/O: C-section; Gestational diabetes mellitus (GDM) affecting pregnancy, antepartum; Prediabetes; Previous cesarean section; Hirsutism; Morbid (severe) obesity due to excess calories (HCC); Hypertension; Supervision of high risk pregnancy, antepartum; and Chronic hypertension in pregnancy on their problem list.  Patient reports no complaints.  Contractions: Not present. Vag. Bleeding: None.  Movement: Present. Denies leaking of fluid.   The following portions of the patient's history were reviewed and updated as appropriate: allergies, current medications, past family history, past medical history, past social history, past surgical history and problem list.   Objective:   Vitals:   05/20/19 1530  BP: 123/81  Pulse: 98  Weight: (!) 338 lb (153.3 kg)    Fetal Status: Fetal Heart Rate (bpm): 164   Movement: Present     General:  Alert, oriented and cooperative. Patient is in no acute distress.  Skin: Skin is warm and dry. No rash noted.   Cardiovascular: Normal heart rate noted  Respiratory: Normal respiratory effort, no problems with respiration noted  Abdomen: Soft, gravid, appropriate for gestational age.  Pain/Pressure: Absent     Pelvic: Cervical exam performed Dilation: Closed Effacement (%): Thick Station: Ballotable  Extremities: Normal range of motion.  Edema: None  Mental Status: Normal mood and affect. Normal behavior. Normal judgment and thought content.   Imaging: [name] MFM FETAL BPP WO NON STRESS  Result Date: 05/13/2019 ----------------------------------------------------------------------  OBSTETRICS REPORT                       (Signed Final 05/13/2019 04:20 pm) ---------------------------------------------------------------------- Patient Info  ID #:        [id number]                          D.O.B.:  [birth year] (39 yrs)  Name:       Miranda Ruiz                     Visit Date: 05/13/2019 03:58 pm ---------------------------------------------------------------------- Performed By  Performed By:     Percell Boston          Ref. Address:     [address]                    [place]                                                             [address]  Attending:        Ma Rings MD         [facility]  Referred By:      [facility] ---------------------------------------------------------------------- Orders   #  Description                          Code         Ordered By   1  [name] MFM FETAL BPP WO NON              76819.01     RAVI [facility]      STRESS  ----------------------------------------------------------------------   #  Order #                    Accession #                 Episode #   1  [id number]                  [id number]                  [id number]  ---------------------------------------------------------------------- Indications   Gestational diabetes in pregnancy,             O24.415   controlled by oral hypoglycemic drugs   Advanced maternal age multigravida 6+,        O38.523   third trimester   [redacted] weeks gestation of pregnancy                Z3A.35   Encounter for other antenatal screening        Z36.2   follow-up   Maternal morbid obesity                        O99.210 E66.01   Poor obstetric history: Previous gestational   O09.299   diabetes   Previous cesarean delivery x 2, antepartum     O34.219  ---------------------------------------------------------------------- Fetal Evaluation  Num Of Fetuses:         1  Fetal Heart Rate(bpm):  143  Cardiac Activity:       Observed  Presentation:           Cephalic  Amniotic Fluid  AFI FV:      Within normal limits  AFI Sum(cm)     %Tile       Largest Pocket(cm)  15.44           57          7.88  RUQ(cm)       RLQ(cm)        LUQ(cm)        LLQ(cm)  4.01          7.88          0              3.55 ---------------------------------------------------------------------- Biophysical Evaluation  Amniotic F.V:   Within normal limits       F. Tone:        Observed  F. Movement:    Observed                   Score:          8/8  F. Breathing:   Observed ---------------------------------------------------------------------- OB History  Gravidity:    3         Term:   2        Prem:   0        SAB:   0  TOP:          0       Ectopic:  0  Living: 2 ---------------------------------------------------------------------- Gestational Age  LMP:           35w 6d        Date:  09/04/18                 EDD:   06/11/19  Best:          Barbie Haggis 6d     Det. By:  LMP  (09/04/18)          EDD:   06/11/19 ---------------------------------------------------------------------- Anatomy  Thoracic:              Appears normal         Bladder:                Appears normal  Abdomen:               Appears normal ---------------------------------------------------------------------- Comments  This patient was seen for a biophysical profile due to A2  gestational diabetes currently treated with metformin.  She  reports that her fasting fingerstick values remain elevated.  Her Metformin dosage was  increased recently.  A biophysical profile performed today was 8 out of 8.  There was normal amniotic fluid noted on today's ultrasound  exam.  Should her fingerstick values remain elevated, delivery may  be considered at between 37 to 38 weeks.  Another biophysical profile was scheduled in 1 week. ----------------------------------------------------------------------                   Johnell Comings, MD Electronically Signed Final Report   05/13/2019 04:20 pm ----------------------------------------------------------------------  [name] MFM FETAL BPP WO NON STRESS  Result Date: 04/29/2019 ----------------------------------------------------------------------  OBSTETRICS REPORT                        (Signed Final 04/29/2019 03:49 pm) ---------------------------------------------------------------------- Patient Info  ID #:       [id number]                          D.O.B.:  [birth year] (38 yrs)  Name:       Janyth Pupa                     Visit Date: 04/29/2019 03:13 pm ---------------------------------------------------------------------- Performed By  Performed By:     Valda Favia          Ref. Address:     [address]                                                             [address]  Attending:        Tama High MD        Location[facility]  Referred  By:      Lenis Dickinson ---------------------------------------------------------------------- Orders   #  Description                          Code         Ordered By   1  [name] MFM FETAL BPP WO NON              76819.01     RAVI SHANK[facility]      STRESS   2  [name] MFM OB FOLLOW UP                  16109.60     RAVI [facility]  ----------------------------------------------------------------------   #  Order #                    Accession #                 Episode #   1  [id number]                  [id number]   2  [id number]                  [id number]                  [id number]  ---------------------------------------------------------------------- Indications   [redacted] weeks gestation of pregnancy                Z3A.32   Maternal morbid obesity                        O99.210 E66.01   Poor obstetric history: Previous gestational   O09.299   diabetes   Unspecified maternal hypertension, second      O16.2   trimester (Taking ASA)   Previous cesarean delivery x 2, antepartum     O32.219   Advanced maternal age multigravida 32+,        O90.522   second trimester (Neg AFP, LOW risk NIPS)   Encounter for other antenatal screening        Z36.2   follow-up  ---------------------------------------------------------------------- Fetal  Evaluation  Num Of Fetuses:         1  Fetal Heart Rate(bpm):  145  Cardiac Activity:       Observed  Presentation:           Breech  Placenta:               Posterior  P. Cord Insertion:      Previously Visualized  Amniotic Fluid  AFI FV:      Within normal limits  AFI Sum(cm)     %Tile       Largest Pocket(cm)  17.72           65          7.09  RUQ(cm)       RLQ(cm)       LUQ(cm)        LLQ(cm)  4.08          0             7.09           6.55 ---------------------------------------------------------------------- Biophysical Evaluation  Amniotic F.V:   Within normal limits       F. Tone:  Observed  F. Movement:    Observed                   Score:          8/8  F. Breathing:   Observed ---------------------------------------------------------------------- Biometry  BPD:      78.3  mm     G. Age:  31w 3d          9  %    CI:        71.36   %    70 - 86                                                          FL/HC:      22.1   %    19.9 - 21.5  HC:      295.2  mm     G. Age:  32w 4d         11  %    HC/AC:      0.96        0.96 - 1.11  AC:      306.7  mm     G. Age:  34w 4d         92  %    FL/BPD:     83.4   %    71 - 87  FL:       65.3  mm     G. Age:  33w 5d         61  %    FL/AC:      21.3   %    20 - 24  Est. FW:    2283  gm      5 lb 1 oz     71  % ---------------------------------------------------------------------- OB History  Gravidity:    3         Term:   2        Prem:   0        SAB:   0  TOP:          0       Ectopic:  0        Living: 2 ---------------------------------------------------------------------- Gestational Age  LMP:           33w 6d        Date:  09/04/18                 EDD:   06/11/19  U/S Today:     33w 1d                                        EDD:   06/16/19  Best:          32w 6d     Det. By:  U/S  (03/03/19)          EDD:   06/18/19 ---------------------------------------------------------------------- Anatomy  Cranium:               Appears normal         LVOT:                    Previously  seen  Cavum:                 Previously seen        Aortic Arch:            Previously seen  Ventricles:            Appears normal         Ductal Arch:            Previously seen  Choroid Plexus:        Previously seen        Diaphragm:              Previously seen  Cerebellum:            Previously seen        Stomach:                Appears normal, left                                                                        sided  Posterior Fossa:       Previously seen        Abdomen:                Previously seen  Nuchal Fold:           Not applicable (>20    Abdominal Wall:         Previously seen                         wks GA)  Face:                  Orbits and profile     Cord Vessels:           Previously seen                         previously seen  Lips:                  Previously seen        Kidneys:                Previously seen  Palate:                Not well visualized    Bladder:                Appears normal  Thoracic:              Appears normal         Spine:                  Limited views                                                                        previously seen  Heart:  Previously seen        Upper Extremities:      Previously seen  RVOT:                  Previously seen        Lower Extremities:      Previously seen  Other:  Female gender previously seen. Heels previously seen. Technically          difficult due to maternal habitus and fetal position. ---------------------------------------------------------------------- Cervix Uterus Adnexa  Cervix  Not visualized (advanced GA >24wks) ---------------------------------------------------------------------- Impression  Gestational diabetes.  Patient takes Metformin 500 mg at  night.  She reports she has started checking her blood  glucose but is unsure of fasting levels.  Amniotic fluid is normal and good fetal activity is seen. Fetal  growth is appropriate for gestational age. Antenatal testing is   reassuring. BPP 8/8.  We reassured the patient of the findings.  I emphasized the  importance of good blood glucose control to prevent adverse  fetal or neonatal outcomes.  I have recommended that she  bring blood glucose values at every ultrasound visit. ---------------------------------------------------------------------- Recommendations  -Continue weekly BPP till delivery. ----------------------------------------------------------------------                  Noralee Spaceavi Shankar, MD Electronically Signed Final Report   04/29/2019 03:49 pm ----------------------------------------------------------------------  US MFM OB FOLLOW UP  Result Date: 04/29/2019 ----------------------------------------------------------------------  OBSTETRICS REPORT                       (Signed Final 04/29/2019 03:49 pm) ---------------------------------------------------------------------- Patient Info  ID #:       [id number]                          D.O.B.:  1980/12/11 (38 yrs)  Name:       Miranda GinsARON Aytes                     Visit Date: 04/29/2019 03:13 pm ---------------------------------------------------------------------- Performed By  Performed By:     Percell BostonHeather Waken          Ref. Address:     [address]                    [place]                                                             [address]  Attending:        Noralee Spaceavi Shankar MD        [facility]  Referred By:      [facility] ---------------------------------------------------------------------- Orders   #  Description  Code         Ordered By   1  US MFM FETAL BPP WO NON              [id number]     RAVI SHANKAR      STRESS   2  US MFM OB FOLLOW UP                  [id number]     [facility]  ----------------------------------------------------------------------   #  Order #                    Accession #                 Episode #   1  [id number]                   [id number]   2  [id number]                  [id number]                  [id number]  ---------------------------------------------------------------------- Indications   [redacted] weeks gestation of pregnancy                Z3A.32   Maternal morbid obesity                        O99.210 E66.01   Poor obstetric history: Previous gestational   O09.299   diabetes   Unspecified maternal hypertension, second      O16.2   trimester (Taking ASA)   Previous cesarean delivery x 2, antepartum     89O34.219   Advanced maternal age multigravida 6635+,        55O09.522   second trimester (Neg AFP, LOW risk NIPS)   Encounter for other antenatal screening        Z36.2   follow-up  ---------------------------------------------------------------------- Fetal Evaluation  Num Of Fetuses:         1  Fetal Heart Rate(bpm):  145  Cardiac Activity:       Observed  Presentation:           Breech  Placenta:               Posterior  P. Cord Insertion:      Previously Visualized  Amniotic Fluid  AFI FV:      Within normal limits  AFI Sum(cm)     %Tile       Largest Pocket(cm)  17.72           65          7.09  RUQ(cm)       RLQ(cm)       LUQ(cm)        LLQ(cm)  4.08          0             7.09           6.55 ---------------------------------------------------------------------- Biophysical Evaluation  Amniotic F.V:   Within normal limits       F. Tone:        Observed  F. Movement:    Observed                   Score:  8/8  F. Breathing:   Observed ---------------------------------------------------------------------- Biometry  BPD:      78.3  mm     G. Age:  31w 3d          9  %    CI:        71.36   %    70 - 86                                                          FL/HC:      22.1   %    19.9 - 21.5  HC:      295.2  mm     G. Age:  32w 4d         11  %    HC/AC:      0.96        0.96 - 1.11  AC:      306.7  mm     G. Age:  34w 4d         92  %    FL/BPD:     83.4   %    71 - 87  FL:       65.3  mm     G. Age:  33w 5d          61  %    FL/AC:      21.3   %    20 - 24  Est. FW:    2283  gm      5 lb 1 oz     71  % ---------------------------------------------------------------------- OB History  Gravidity:    3         Term:   2        Prem:   0        SAB:   0  TOP:          0       Ectopic:  0        Living: 2 ---------------------------------------------------------------------- Gestational Age  LMP:           33w 6d        Date:  09/04/18                 EDD:   06/11/19  U/S Today:     33w 1d                                        EDD:   06/16/19  Best:          32w 6d     Det. By:  U/S  (03/03/19)          EDD:   06/18/19 ---------------------------------------------------------------------- Anatomy  Cranium:               Appears normal         LVOT:                   Previously seen  Cavum:                 Previously seen        Aortic Arch:  Previously seen  Ventricles:            Appears normal         Ductal Arch:            Previously seen  Choroid Plexus:        Previously seen        Diaphragm:              Previously seen  Cerebellum:            Previously seen        Stomach:                Appears normal, left                                                                        sided  Posterior Fossa:       Previously seen        Abdomen:                Previously seen  Nuchal Fold:           Not applicable (>20    Abdominal Wall:         Previously seen                         wks GA)  Face:                  Orbits and profile     Cord Vessels:           Previously seen                         previously seen  Lips:                  Previously seen        Kidneys:                Previously seen  Palate:                Not well visualized    Bladder:                Appears normal  Thoracic:              Appears normal         Spine:                  Limited views                                                                        previously seen  Heart:                 Previously seen        Upper  Extremities:      Previously seen  RVOT:  Previously seen        Lower Extremities:      Previously seen  Other:  Female gender previously seen. Heels previously seen. Technically          difficult due to maternal habitus and fetal position. ---------------------------------------------------------------------- Cervix Uterus Adnexa  Cervix  Not visualized (advanced GA >24wks) ---------------------------------------------------------------------- Impression  Gestational diabetes.  Patient takes Metformin 500 mg at  night.  She reports she has started checking her blood  glucose but is unsure of fasting levels.  Amniotic fluid is normal and good fetal activity is seen. Fetal  growth is appropriate for gestational age. Antenatal testing is  reassuring. BPP 8/8.  We reassured the patient of the findings.  I emphasized the  importance of good blood glucose control to prevent adverse  fetal or neonatal outcomes.  I have recommended that she  bring blood glucose values at every ultrasound visit. ---------------------------------------------------------------------- Recommendations  -Continue weekly BPP till delivery. ----------------------------------------------------------------------                  Noralee Space, MD Electronically Signed Final Report   04/29/2019 03:49 pm ----------------------------------------------------------------------   Assessment and Plan:  Pregnancy: [id number] at [redacted]w[redacted]d 1. Gestational diabetes mellitus (GDM), antepartum, gestational diabetes method of control unspecified See CBGs below, all are elevated.  Given that she is going to be 37 weeks, recommend delivery. Discussed with Dr. Judeth Cornfield who agrees with plan.  Follow up BPP today at [facility].  Cesarean delivery to be done tomorrow, called [facility] OR RN in charge to add this on.  Will also have IUD insertion at that time. NPO after MN advised, report to [facility] at 0800.   2. Supervision of high risk pregnancy, antepartum Pelvic  cultures done. - Culture, beta strep (group b only) - GC/Chlamydia probe amp ([facility])not at [facility] Labor symptoms and general obstetric precautions including but not limited to vaginal bleeding, contractions, leaking of fluid and fetal movement were reviewed in detail with the patient. Please refer to After Visit Summary for other counseling recommendations.   Return in about 2 weeks (around 06/03/2019) for Postoperative check/incision check.  Future Appointments  Date Time Provider Department Center  05/28/2019 11:15 AM Adam Phenix, MD [facility]  06/03/2019  2:15 PM Vernon Bing, MD [facility]  06/10/2019  4:15 PM [place] Bing, MD [facility]    Jaynie Collins, MD

## 2019-05-21 ENCOUNTER — Inpatient Hospital Stay (HOSPITAL_COMMUNITY): Payer: PRIVATE HEALTH INSURANCE | Admitting: Anesthesiology

## 2019-05-21 ENCOUNTER — Other Ambulatory Visit: Payer: Self-pay

## 2019-05-21 ENCOUNTER — Encounter (HOSPITAL_COMMUNITY): Payer: Self-pay | Admitting: Obstetrics & Gynecology

## 2019-05-21 ENCOUNTER — Inpatient Hospital Stay (HOSPITAL_COMMUNITY)
Admission: RE | Admit: 2019-05-21 | Discharge: 2019-05-23 | DRG: 787 | Disposition: A | Discharge status: Home / Self Care | Payer: PRIVATE HEALTH INSURANCE | Attending: Obstetrics & Gynecology | Admitting: Obstetrics & Gynecology

## 2019-05-21 ENCOUNTER — Encounter (HOSPITAL_COMMUNITY)
Admission: RE | Disposition: A | Discharge status: Home / Self Care | Payer: Self-pay | Source: Home / Self Care | Attending: Obstetrics & Gynecology

## 2019-05-21 DIAGNOSIS — I10 Essential (primary) hypertension: Secondary | ICD-10-CM | POA: Diagnosis present

## 2019-05-21 DIAGNOSIS — O1002 Pre-existing essential hypertension complicating childbirth: Secondary | ICD-10-CM | POA: Diagnosis present

## 2019-05-21 DIAGNOSIS — Z20822 Contact with and (suspected) exposure to covid-19: Secondary | ICD-10-CM | POA: Diagnosis present

## 2019-05-21 DIAGNOSIS — O24425 Gestational diabetes mellitus in childbirth, controlled by oral hypoglycemic drugs: Secondary | ICD-10-CM | POA: Diagnosis present

## 2019-05-21 DIAGNOSIS — Z30430 Encounter for insertion of intrauterine contraceptive device: Secondary | ICD-10-CM

## 2019-05-21 DIAGNOSIS — Z87891 Personal history of nicotine dependence: Secondary | ICD-10-CM

## 2019-05-21 DIAGNOSIS — Z3A37 37 weeks gestation of pregnancy: Secondary | ICD-10-CM

## 2019-05-21 DIAGNOSIS — O99214 Obesity complicating childbirth: Secondary | ICD-10-CM | POA: Diagnosis present

## 2019-05-21 DIAGNOSIS — O34211 Maternal care for low transverse scar from previous cesarean delivery: Principal | ICD-10-CM | POA: Diagnosis present

## 2019-05-21 DIAGNOSIS — Z98891 History of uterine scar from previous surgery: Secondary | ICD-10-CM

## 2019-05-21 DIAGNOSIS — O34219 Maternal care for unspecified type scar from previous cesarean delivery: Secondary | ICD-10-CM

## 2019-05-21 DIAGNOSIS — Z8632 Personal history of gestational diabetes: Secondary | ICD-10-CM | POA: Diagnosis present

## 2019-05-21 LAB — GC/CHLAMYDIA PROBE AMP (~~LOC~~) NOT AT ARMC
Chlamydia: NEGATIVE
Comment: NEGATIVE
Comment: NORMAL
Neisseria Gonorrhea: NEGATIVE

## 2019-05-21 LAB — GLUCOSE, CAPILLARY
Glucose-Capillary: 110 mg/dL — ABNORMAL HIGH (ref 70–99)
Glucose-Capillary: 100 mg/dL — ABNORMAL HIGH (ref 70–99)

## 2019-05-21 LAB — CBC
HCT: 34.5 % — ABNORMAL LOW (ref 36.0–46.0)
HCT: 31.9 % — ABNORMAL LOW (ref 36.0–46.0)
Hemoglobin: 11.2 g/dL — ABNORMAL LOW (ref 12.0–15.0)
Hemoglobin: 10.5 g/dL — ABNORMAL LOW (ref 12.0–15.0)
MCH: 29.6 pg (ref 26.0–34.0)
MCH: 29.8 pg (ref 26.0–34.0)
MCHC: 32.5 g/dL (ref 30.0–36.0)
MCHC: 32.9 g/dL (ref 30.0–36.0)
MCV: 91.3 fL (ref 80.0–100.0)
MCV: 90.6 fL (ref 80.0–100.0)
Platelets: 204 10*3/uL (ref 150–400)
Platelets: 180 10*3/uL (ref 150–400)
RBC: 3.78 MIL/uL — ABNORMAL LOW (ref 3.87–5.11)
RBC: 3.52 MIL/uL — ABNORMAL LOW (ref 3.87–5.11)
RDW: 12.5 % (ref 11.5–15.5)
RDW: 12.6 % (ref 11.5–15.5)
WBC: 5.6 10*3/uL (ref 4.0–10.5)
WBC: 4.9 10*3/uL (ref 4.0–10.5)
nRBC: 0 % (ref 0.0–0.2)
nRBC: 0 % (ref 0.0–0.2)

## 2019-05-21 LAB — TYPE AND SCREEN
ABO/RH(D): O POS
Antibody Screen: NEGATIVE

## 2019-05-21 LAB — RESPIRATORY PANEL BY RT PCR (FLU A&B, COVID)
Influenza A by PCR: NEGATIVE
Influenza B by PCR: NEGATIVE
SARS Coronavirus 2 by RT PCR: NEGATIVE

## 2019-05-21 LAB — COMPREHENSIVE METABOLIC PANEL
ALT: 15 U/L (ref 0–44)
AST: 35 U/L (ref 15–41)
Albumin: 3.1 g/dL — ABNORMAL LOW (ref 3.5–5.0)
Alkaline Phosphatase: 64 U/L (ref 38–126)
Anion gap: 10 (ref 5–15)
BUN: 8 mg/dL (ref 6–20)
CO2: 19 mmol/L — ABNORMAL LOW (ref 22–32)
Calcium: 9 mg/dL (ref 8.9–10.3)
Chloride: 107 mmol/L (ref 98–111)
Creatinine, Ser: 0.65 mg/dL (ref 0.44–1.00)
GFR calc Af Amer: >60 mL/min (ref >60)
GFR calc non Af Amer: >60 mL/min (ref >60)
Glucose, Bld: 101 mg/dL — ABNORMAL HIGH (ref 70–99)
Potassium: 4.5 mmol/L (ref 3.5–5.1)
Sodium: 136 mmol/L (ref 135–145)
Total Bilirubin: 1.2 mg/dL (ref 0.3–1.2)
Total Protein: 6.3 g/dL — ABNORMAL LOW (ref 6.5–8.1)

## 2019-05-21 LAB — RPR: RPR Ser Ql: NONREACTIVE

## 2019-05-21 LAB — ABO/RH: ABO/RH(D): O POS

## 2019-05-21 SURGERY — Surgical Case
Anesthesia: Spinal | Wound class: Clean Contaminated

## 2019-05-21 MED ORDER — DIBUCAINE (PERIANAL) 1 % EX OINT: 1.0000 "application " | TOPICAL_OINTMENT | CUTANEOUS | Status: DC | PRN

## 2019-05-21 MED ORDER — ZOLPIDEM TARTRATE 5 MG PO TABS: 5.0000 mg | ORAL_TABLET | Freq: Every evening | ORAL | Status: DC | PRN

## 2019-05-21 MED ORDER — SOD CITRATE-CITRIC ACID 500-334 MG/5ML PO SOLN
30.0000 mL | ORAL | Status: AC
  Administered 2019-05-21: 10:00:00 30 mL via ORAL

## 2019-05-21 MED ORDER — MORPHINE SULFATE (PF) 0.5 MG/ML IJ SOLN
INTRAMUSCULAR | Status: DC | PRN
  Administered 2019-05-21: .15 mg via INTRATHECAL

## 2019-05-21 MED ORDER — METFORMIN HCL 500 MG PO TABS
500.0000 mg | ORAL_TABLET | Freq: Every day | ORAL | Status: DC
  Administered 2019-05-21 – 2019-05-22 (×2): 500 mg via ORAL
  Filled 2019-05-21 (×2): qty 1

## 2019-05-21 MED ORDER — ACETAMINOPHEN 10 MG/ML IV SOLN
INTRAVENOUS | Status: AC
  Filled 2019-05-21: qty 100

## 2019-05-21 MED ORDER — NALOXONE HCL 4 MG/10ML IJ SOLN
1.0000 ug/kg/h | INTRAVENOUS | Status: DC | PRN
  Filled 2019-05-21: qty 5

## 2019-05-21 MED ORDER — BUPIVACAINE HCL (PF) 0.5 % IJ SOLN
INTRAMUSCULAR | Status: DC | PRN
  Administered 2019-05-21: 30 mL

## 2019-05-21 MED ORDER — KETOROLAC TROMETHAMINE 30 MG/ML IJ SOLN: 30.0000 mg | Freq: Four times a day (QID) | INTRAMUSCULAR | Status: AC | PRN

## 2019-05-21 MED ORDER — DEXTROSE 5 % IV SOLN
INTRAVENOUS | Status: AC
  Filled 2019-05-21: qty 3000

## 2019-05-21 MED ORDER — IBUPROFEN 800 MG PO TABS
800.0000 mg | ORAL_TABLET | Freq: Four times a day (QID) | ORAL | Status: DC
  Administered 2019-05-22 – 2019-05-23 (×5): 800 mg via ORAL
  Filled 2019-05-21 (×5): qty 1

## 2019-05-21 MED ORDER — MORPHINE SULFATE (PF) 0.5 MG/ML IJ SOLN
INTRAMUSCULAR | Status: AC
  Filled 2019-05-21: qty 10

## 2019-05-21 MED ORDER — DEXTROSE 5 % IV SOLN
3.0000 g | INTRAVENOUS | Status: AC
  Administered 2019-05-21: 3 g via INTRAVENOUS

## 2019-05-21 MED ORDER — SIMETHICONE 80 MG PO CHEW
80.0000 mg | CHEWABLE_TABLET | ORAL | Status: DC
  Administered 2019-05-22 – 2019-05-23 (×2): 80 mg via ORAL
  Filled 2019-05-21 (×2): qty 1

## 2019-05-21 MED ORDER — SCOPOLAMINE 1 MG/3DAYS TD PT72
MEDICATED_PATCH | TRANSDERMAL | Status: AC
  Filled 2019-05-21: qty 1

## 2019-05-21 MED ORDER — NALOXONE HCL 0.4 MG/ML IJ SOLN: 0.4000 mg | INTRAMUSCULAR | Status: DC | PRN

## 2019-05-21 MED ORDER — COCONUT OIL OIL: 1.0000 "application " | TOPICAL_OIL | Status: DC | PRN

## 2019-05-21 MED ORDER — ONDANSETRON HCL 4 MG/2ML IJ SOLN
4.0000 mg | Freq: Three times a day (TID) | INTRAMUSCULAR | Status: DC | PRN
  Administered 2019-05-21: 20:00:00 4 mg via INTRAVENOUS
  Filled 2019-05-21: qty 2

## 2019-05-21 MED ORDER — KETOROLAC TROMETHAMINE 30 MG/ML IJ SOLN
30.0000 mg | Freq: Four times a day (QID) | INTRAMUSCULAR | Status: AC | PRN
  Administered 2019-05-21: 13:00:00 30 mg via INTRAMUSCULAR

## 2019-05-21 MED ORDER — FENTANYL CITRATE (PF) 100 MCG/2ML IJ SOLN: 25.0000 ug | INTRAMUSCULAR | Status: DC | PRN

## 2019-05-21 MED ORDER — SODIUM CHLORIDE 0.9% FLUSH: 3.0000 mL | INTRAVENOUS | Status: DC | PRN

## 2019-05-21 MED ORDER — SODIUM CHLORIDE 0.9 % IR SOLN
Status: DC | PRN
  Administered 2019-05-21: 1

## 2019-05-21 MED ORDER — KETOROLAC TROMETHAMINE 30 MG/ML IJ SOLN
INTRAMUSCULAR | Status: AC
  Filled 2019-05-21: qty 1

## 2019-05-21 MED ORDER — LACTATED RINGERS IV SOLN: INTRAVENOUS | Status: DC | PRN

## 2019-05-21 MED ORDER — DIPHENHYDRAMINE HCL 25 MG PO CAPS: 25.0000 mg | ORAL_CAPSULE | Freq: Four times a day (QID) | ORAL | Status: DC | PRN

## 2019-05-21 MED ORDER — LEVONORGESTREL 19.5 MCG/DAY IU IUD
INTRAUTERINE_SYSTEM | Freq: Once | INTRAUTERINE | Status: AC
  Administered 2019-05-21: 1 via INTRAUTERINE

## 2019-05-21 MED ORDER — SODIUM CHLORIDE 0.9 % IV SOLN: INTRAVENOUS | Status: DC | PRN

## 2019-05-21 MED ORDER — FENTANYL CITRATE (PF) 100 MCG/2ML IJ SOLN
INTRAMUSCULAR | Status: AC
  Filled 2019-05-21: qty 2

## 2019-05-21 MED ORDER — SENNOSIDES-DOCUSATE SODIUM 8.6-50 MG PO TABS
2.0000 | ORAL_TABLET | ORAL | Status: DC
  Administered 2019-05-22 – 2019-05-23 (×2): 2 via ORAL
  Filled 2019-05-21 (×2): qty 2

## 2019-05-21 MED ORDER — SIMETHICONE 80 MG PO CHEW: 80.0000 mg | CHEWABLE_TABLET | ORAL | Status: DC | PRN

## 2019-05-21 MED ORDER — ACETAMINOPHEN 500 MG PO TABS
1000.0000 mg | ORAL_TABLET | Freq: Four times a day (QID) | ORAL | Status: DC
  Administered 2019-05-22 – 2019-05-23 (×7): 1000 mg via ORAL
  Filled 2019-05-21 (×8): qty 2

## 2019-05-21 MED ORDER — FENTANYL CITRATE (PF) 100 MCG/2ML IJ SOLN
INTRAMUSCULAR | Status: DC | PRN
  Administered 2019-05-21: 15 ug via INTRATHECAL

## 2019-05-21 MED ORDER — DIPHENHYDRAMINE HCL 25 MG PO CAPS
25.0000 mg | ORAL_CAPSULE | ORAL | Status: DC | PRN
  Administered 2019-05-22: 02:00:00 25 mg via ORAL
  Filled 2019-05-21: qty 1

## 2019-05-21 MED ORDER — ACETAMINOPHEN 500 MG PO TABS: 1000.0000 mg | ORAL_TABLET | Freq: Four times a day (QID) | ORAL | Status: DC

## 2019-05-21 MED ORDER — NALBUPHINE HCL 10 MG/ML IJ SOLN: 5.0000 mg | INTRAMUSCULAR | Status: DC | PRN

## 2019-05-21 MED ORDER — ONDANSETRON HCL 4 MG/2ML IJ SOLN
INTRAMUSCULAR | Status: AC
  Filled 2019-05-21: qty 2

## 2019-05-21 MED ORDER — DIPHENHYDRAMINE HCL 50 MG/ML IJ SOLN
12.5000 mg | INTRAMUSCULAR | Status: DC | PRN
  Administered 2019-05-21: 12.5 mg via INTRAVENOUS

## 2019-05-21 MED ORDER — ENOXAPARIN SODIUM 40 MG/0.4ML ~~LOC~~ SOLN: 40.0000 mg | SUBCUTANEOUS | Status: DC

## 2019-05-21 MED ORDER — HYDROMORPHONE HCL 1 MG/ML IJ SOLN: 0.2000 mg | INTRAMUSCULAR | Status: DC | PRN

## 2019-05-21 MED ORDER — LEVONORGESTREL 19.5 MCG/DAY IU IUD
INTRAUTERINE_SYSTEM | INTRAUTERINE | Status: AC
  Filled 2019-05-21: qty 1

## 2019-05-21 MED ORDER — STERILE WATER FOR IRRIGATION IR SOLN
Status: DC | PRN
  Administered 2019-05-21: 1

## 2019-05-21 MED ORDER — LACTATED RINGERS IV SOLN: INTRAVENOUS | Status: DC

## 2019-05-21 MED ORDER — BUPIVACAINE IN DEXTROSE 0.75-8.25 % IT SOLN
INTRATHECAL | Status: DC | PRN
  Administered 2019-05-21: 1.8 mL via INTRATHECAL

## 2019-05-21 MED ORDER — OXYTOCIN 40 UNITS IN NORMAL SALINE INFUSION - SIMPLE MED
INTRAVENOUS | Status: AC
  Filled 2019-05-21: qty 1000

## 2019-05-21 MED ORDER — KETOROLAC TROMETHAMINE 30 MG/ML IJ SOLN
30.0000 mg | Freq: Four times a day (QID) | INTRAMUSCULAR | Status: AC
  Administered 2019-05-21 – 2019-05-22 (×3): 30 mg via INTRAVENOUS
  Filled 2019-05-21 (×3): qty 1

## 2019-05-21 MED ORDER — OXYTOCIN 40 UNITS IN NORMAL SALINE INFUSION - SIMPLE MED
INTRAVENOUS | Status: DC | PRN
  Administered 2019-05-21: 40 [IU] via INTRAVENOUS

## 2019-05-21 MED ORDER — DIPHENHYDRAMINE HCL 50 MG/ML IJ SOLN
INTRAMUSCULAR | Status: AC
  Filled 2019-05-21: qty 1

## 2019-05-21 MED ORDER — SIMETHICONE 80 MG PO CHEW
80.0000 mg | CHEWABLE_TABLET | Freq: Three times a day (TID) | ORAL | Status: DC
  Administered 2019-05-22 – 2019-05-23 (×4): 80 mg via ORAL
  Filled 2019-05-21 (×4): qty 1

## 2019-05-21 MED ORDER — MENTHOL 3 MG MT LOZG: 1.0000 | LOZENGE | OROMUCOSAL | Status: DC | PRN

## 2019-05-21 MED ORDER — ENOXAPARIN SODIUM 80 MG/0.8ML ~~LOC~~ SOLN
0.5000 mg/kg | SUBCUTANEOUS | Status: DC
  Administered 2019-05-22: 75 mg via SUBCUTANEOUS
  Filled 2019-05-21: qty 0.8

## 2019-05-21 MED ORDER — ONDANSETRON HCL 4 MG/2ML IJ SOLN
INTRAMUSCULAR | Status: DC | PRN
  Administered 2019-05-21: 4 mg via INTRAVENOUS

## 2019-05-21 MED ORDER — OXYTOCIN 40 UNITS IN NORMAL SALINE INFUSION - SIMPLE MED: 2.5000 [IU]/h | INTRAVENOUS | Status: AC

## 2019-05-21 MED ORDER — PHENYLEPHRINE HCL (PRESSORS) 10 MG/ML IV SOLN
INTRAVENOUS | Status: DC | PRN
  Administered 2019-05-21 (×6): 80 ug via INTRAVENOUS

## 2019-05-21 MED ORDER — PRENATAL MULTIVITAMIN CH
1.0000 | ORAL_TABLET | Freq: Every day | ORAL | Status: DC
  Administered 2019-05-22 – 2019-05-23 (×2): 1 via ORAL
  Filled 2019-05-21 (×2): qty 1

## 2019-05-21 MED ORDER — ACETAMINOPHEN 10 MG/ML IV SOLN
1000.0000 mg | Freq: Once | INTRAVENOUS | Status: DC | PRN
  Administered 2019-05-21: 1000 mg via INTRAVENOUS

## 2019-05-21 MED ORDER — TETANUS-DIPHTH-ACELL PERTUSSIS 5-2.5-18.5 LF-MCG/0.5 IM SUSP: 0.5000 mL | Freq: Once | INTRAMUSCULAR | Status: DC

## 2019-05-21 MED ORDER — KETOROLAC TROMETHAMINE 30 MG/ML IJ SOLN: 30.0000 mg | Freq: Once | INTRAMUSCULAR | Status: DC | PRN

## 2019-05-21 MED ORDER — PHENYLEPHRINE 40 MCG/ML (10ML) SYRINGE FOR IV PUSH (FOR BLOOD PRESSURE SUPPORT)
PREFILLED_SYRINGE | INTRAVENOUS | Status: AC
  Filled 2019-05-21: qty 20

## 2019-05-21 MED ORDER — NALBUPHINE HCL 10 MG/ML IJ SOLN: 5.0000 mg | Freq: Once | INTRAMUSCULAR | Status: DC | PRN

## 2019-05-21 MED ORDER — OXYCODONE HCL 5 MG PO TABS
5.0000 mg | ORAL_TABLET | ORAL | Status: DC | PRN
  Administered 2019-05-22: 5 mg via ORAL
  Administered 2019-05-23: 10 mg via ORAL
  Filled 2019-05-21: qty 1
  Filled 2019-05-21: qty 2

## 2019-05-21 MED ORDER — WITCH HAZEL-GLYCERIN EX PADS: 1.0000 "application " | MEDICATED_PAD | CUTANEOUS | Status: DC | PRN

## 2019-05-21 MED ORDER — SOD CITRATE-CITRIC ACID 500-334 MG/5ML PO SOLN
ORAL | Status: AC
  Filled 2019-05-21: qty 30

## 2019-05-21 MED ORDER — PHENYLEPHRINE HCL-NACL 20-0.9 MG/250ML-% IV SOLN
INTRAVENOUS | Status: DC | PRN
  Administered 2019-05-21: 60 ug/min via INTRAVENOUS

## 2019-05-21 MED ORDER — BUPIVACAINE HCL (PF) 0.5 % IJ SOLN
INTRAMUSCULAR | Status: AC
  Filled 2019-05-21: qty 30

## 2019-05-21 MED ORDER — SCOPOLAMINE 1 MG/3DAYS TD PT72
1.0000 | MEDICATED_PATCH | Freq: Once | TRANSDERMAL | Status: DC
  Administered 2019-05-21: 11:00:00 1.5 mg via TRANSDERMAL

## 2019-05-21 SURGICAL SUPPLY — 43 items
BARRIER ADHS 3X4 INTERCEED (GAUZE/BANDAGES/DRESSINGS) IMPLANT
BENZOIN TINCTURE PRP APPL 2/3 (GAUZE/BANDAGES/DRESSINGS) ×3 IMPLANT
CHLORAPREP W/TINT 26ML (MISCELLANEOUS) ×3 IMPLANT
CLAMP CORD UMBIL (MISCELLANEOUS) IMPLANT
DRESSING PREVENA PLUS CUSTOM (GAUZE/BANDAGES/DRESSINGS) ×1 IMPLANT
DRSG OPSITE POSTOP 4X10 (GAUZE/BANDAGES/DRESSINGS) ×3 IMPLANT
DRSG PREVENA PLUS CUSTOM (GAUZE/BANDAGES/DRESSINGS) ×3
ELECT REM PT RETURN 9FT ADLT (ELECTROSURGICAL) ×3
ELECTRODE REM PT RTRN 9FT ADLT (ELECTROSURGICAL) ×1 IMPLANT
EXTRACTOR VACUUM KIWI (MISCELLANEOUS) ×3 IMPLANT
GLOVE BIO SURGEON STRL SZ 6.5 (GLOVE) ×2 IMPLANT
GLOVE BIO SURGEONS STRL SZ 6.5 (GLOVE) ×1
GLOVE BIOGEL PI IND STRL 7.0 (GLOVE) ×1 IMPLANT
GLOVE BIOGEL PI INDICATOR 7.0 (GLOVE) ×2
GOWN STRL REUS W/ TWL LRG LVL3 (GOWN DISPOSABLE) ×2 IMPLANT
GOWN STRL REUS W/TWL LRG LVL3 (GOWN DISPOSABLE) ×4
HEMOSTAT ARISTA ABSORB 3G PWDR (HEMOSTASIS) ×3 IMPLANT
HOVERMATT HALF SINGLE USE (PATIENT TRANSFER) ×3 IMPLANT
KIT ABG SYR 3ML LUER SLIP (SYRINGE) IMPLANT
NEEDLE HYPO 25X5/8 SAFETYGLIDE (NEEDLE) IMPLANT
NEEDLE SPNL 18GX3.5 QUINCKE PK (NEEDLE) ×3 IMPLANT
NS IRRIG 1000ML POUR BTL (IV SOLUTION) ×3 IMPLANT
PACK C SECTION WH (CUSTOM PROCEDURE TRAY) ×3 IMPLANT
PAD OB MATERNITY 4.3X12.25 (PERSONAL CARE ITEMS) ×3 IMPLANT
PENCIL SMOKE EVAC W/HOLSTER (ELECTROSURGICAL) ×3 IMPLANT
RETRACTOR WND ALEXIS 25 LRG (MISCELLANEOUS) ×1 IMPLANT
RTRCTR WOUND ALEXIS 25CM LRG (MISCELLANEOUS) ×3
SPONGE LAP 18X18 RF (DISPOSABLE) ×9 IMPLANT
SUT PDS AB 0 CTX 60 (SUTURE) IMPLANT
SUT PLAIN 2 0 XLH (SUTURE) ×3 IMPLANT
SUT VIC AB 0 CT1 27 (SUTURE)
SUT VIC AB 0 CT1 27XBRD ANBCTR (SUTURE) IMPLANT
SUT VIC AB 0 CT1 36 (SUTURE) IMPLANT
SUT VIC AB 2-0 CT1 27 (SUTURE) ×2
SUT VIC AB 2-0 CT1 TAPERPNT 27 (SUTURE) ×1 IMPLANT
SUT VIC AB 2-0 CTX 36 (SUTURE) ×6 IMPLANT
SUT VIC AB 3-0 CT1 27 (SUTURE) ×2
SUT VIC AB 3-0 CT1 TAPERPNT 27 (SUTURE) ×1 IMPLANT
SUT VIC AB 3-0 SH 27 (SUTURE)
SUT VIC AB 3-0 SH 27X BRD (SUTURE) IMPLANT
SYR 30ML LL (SYRINGE) ×3 IMPLANT
TOWEL OR 17X24 6PK STRL BLUE (TOWEL DISPOSABLE) ×3 IMPLANT
TRAY FOLEY BAG SILVER LF 14FR (SET/KITS/TRAYS/PACK) ×3 IMPLANT

## 2019-05-21 NOTE — Anesthesia Procedure Notes (Signed)
Spinal  Patient location during procedure: OR Start time: 05/21/2019 11:00 AM End time: 05/21/2019 11:10 AM Staffing Performed: anesthesiologist  Anesthesiologist: Elmer Picker, MD Preanesthetic Checklist Completed: patient identified, IV checked, risks and benefits discussed, surgical consent, monitors and equipment checked, pre-op evaluation and timeout performed Spinal Block Patient position: sitting Prep: DuraPrep and site prepped and draped Patient monitoring: cardiac monitor, continuous pulse ox and blood pressure Approach: midline Location: L3-4 Injection technique: single-shot Needle Needle type: Pencan  Needle gauge: 24 G Needle length: 9 cm Assessment Sensory level: T6 Additional Notes Functioning IV was confirmed and monitors were applied. Sterile prep and drape, including hand hygiene and sterile gloves were used. The patient was positioned and the spine was prepped. The skin was anesthetized with lidocaine.  Free flow of clear CSF was obtained prior to injecting local anesthetic into the CSF.  The spinal needle aspirated freely following injection.  The needle was carefully withdrawn.  The patient tolerated the procedure well.

## 2019-05-21 NOTE — MAU Note (Signed)
Covid swab collected. Pt tolerated well. Pt asymptomatic 

## 2019-05-21 NOTE — Anesthesia Preprocedure Evaluation (Signed)
Anesthesia Evaluation  Patient identified by MRN, date of birth, ID band Patient awake    Reviewed: Allergy & Precautions, NPO status , Patient's Chart, lab work & pertinent test results  Airway Mallampati: II  TM Distance: >3 FB Neck ROM: Full    Dental no notable dental hx.    Pulmonary neg pulmonary ROS, former smoker,    Pulmonary exam normal breath sounds clear to auscultation       Cardiovascular hypertension, negative cardio ROS Normal cardiovascular exam Rhythm:Regular Rate:Normal     Neuro/Psych negative neurological ROS  negative psych ROS   GI/Hepatic negative GI ROS, Neg liver ROS,   Endo/Other  diabetes, GestationalMorbid obesity  Renal/GU negative Renal ROS  negative genitourinary   Musculoskeletal negative musculoskeletal ROS (+)   Abdominal   Peds  Hematology negative hematology ROS (+)   Anesthesia Other Findings H/o C/Sx2  Reproductive/Obstetrics                             Anesthesia Physical Anesthesia Plan  ASA: III  Anesthesia Plan: Spinal   Post-op Pain Management:    Induction:   PONV Risk Score and Plan: Treatment may vary due to age or medical condition  Airway Management Planned: Natural Airway  Additional Equipment:   Intra-op Plan:   Post-operative Plan:   Informed Consent: I have reviewed the patients History and Physical, chart, labs and discussed the procedure including the risks, benefits and alternatives for the proposed anesthesia with the patient or authorized representative who has indicated his/her understanding and acceptance.     Dental advisory given  Plan Discussed with: CRNA  Anesthesia Plan Comments:         Anesthesia Quick Evaluation

## 2019-05-21 NOTE — H&P (Addendum)
OBSTETRIC ADMISSION HISTORY AND PHYSICAL  Miranda Ruiz is a 39 y.o. female G3P2002 with IUP at 32w0dpresenting for elective repeat Cesarean section. She reports +FMs. No LOF, VB, blurry vision, headaches, peripheral edema, or RUQ pain. She plans on breast and bottle feeding. She requests IUD for birth control.  Dating: By LMP --->  Estimated Date of Delivery: 06/11/19  Sono:   _0 , normal anatomy, breech presentation, 2283g, 71%ile, EFW 5#1   Prenatal History/Complications: Chronic HTN Morbid obesity H/o Cesarean delivery x2 Gestational diabetes  Past Medical History: Past Medical History:  Diagnosis Date  . Gestational diabetes   . Hypertension    postpartum after 2nd child    Past Surgical History: Past Surgical History:  Procedure Laterality Date  . APPENDECTOMY    . CESAREAN SECTION    . CESAREAN SECTION N/A 02/17/2018   Procedure: CESAREAN SECTION;  Surgeon: STruett Mainland DO;  Location[facility]  Service: Obstetrics;  Laterality: N/A;    Obstetrical History: OB History    Gravida  3   Para  2   Term  2   Preterm  0   AB  0   Living  2     SAB  0   TAB  0   Ectopic  0   Multiple  0   Live Births  2           Social History: Social History   Socioeconomic History  . Marital status: Single    Spouse name: Not on file  . Number of children: Not on file  . Years of education: Not on file  . Highest education level: Not on file  Occupational History  . Not on file  Tobacco Use  . Smoking status: Former Smoker    Types: Cigarettes    Quit date: 05/18/2017    Years since quitting: 2.0  . Smokeless tobacco: Never Used  Substance and Sexual Activity  . Alcohol use: Not Currently    Comment: occasionally  . Drug use: Never  . Sexual activity: Yes    Birth control/protection: None  Other Topics Concern  . Not on file  Social History Narrative  . Not on file   Social Determinants of Health   Financial Resource Strain:    . Difficulty of Paying Living Expenses: Not on file  Food Insecurity: Food Insecurity Present  . Worried About RCharity fundraiserin the Last Year: Sometimes true  . Ran Out of Food in the Last Year: Sometimes true  Transportation Needs:   . Lack of Transportation (Medical): Not on file  . Lack of Transportation (Non-Medical): Not on file  Physical Activity:   . Days of Exercise per Week: Not on file  . Minutes of Exercise per Session: Not on file  Stress:   . Feeling of Stress : Not on file  Social Connections:   . Frequency of Communication with Friends and Family: Not on file  . Frequency of Social Gatherings with Friends and Family: Not on file  . Attends Religious Services: Not on file  . Active Member of Clubs or Organizations: Not on file  . Attends CArchivistMeetings: Not on file  . Marital Status: Not on file    Family History: Family History  Problem Relation Age of Onset  . Cancer Maternal Aunt     Allergies: No Known Allergies  Medications Prior to Admission  Medication Sig Dispense Refill Last Dose  . aspirin EC 81 MG  tablet Take 1 tablet (81 mg total) by mouth daily. 100 tablet 2 Past Week at Unknown time  . metFORMIN (GLUCOPHAGE) 500 MG tablet Take 1 tablet (500 mg total) by mouth at bedtime. 60 tablet 5 05/20/2019 at Unknown time  . Prenatal Vit-Fe Fumarate-FA (MULTIVITAMIN-PRENATAL) 27-0.8 MG TABS tablet Take 1 tablet by mouth daily.    05/20/2019 at Unknown time  . Blood Pressure Monitoring (BLOOD PRESSURE KIT) DEVI 1 Device by Does not apply route as needed. ICD 10 :  O09.90 1 Device 0   . glucose blood (FREESTYLE TEST STRIPS) test strip Use as instructed QID 100 each 12   . glucose monitoring kit (FREESTYLE) monitoring kit 1 each by Does not apply route as needed for other. 1 each 0   . Lancets (FREESTYLE) lancets Use as instructed QID 100 each 12      Review of Systems:  All systems reviewed and negative except as stated in  HPI  PE: Last menstrual period 09/04/2018, not currently breastfeeding. General appearance: alert and cooperative Lungs: regular rate and effort Heart: regular rate  Abdomen: soft, non-tender Extremities: Homans sign is negative, no sign of DVT Presentation: unsure, EFW 7#4 Fetal Heart Tones Confirmed  Prenatal labs: ABO, Rh: O/Positive/-- (07/16 0000) Antibody: Negative (07/16 0000) Rubella: Immune (07/16 0000) RPR: Non Reactive (12/14 1013)  HBsAg: Negative (07/16 0000)  HIV: Non Reactive (12/14 1013)  GBS:   unknown 2 hr GTT 108/217/183  Prenatal Transfer Tool  Maternal Diabetes: Yes, uncontrolled Genetic Screening: Normal Maternal Ultrasounds/Referrals: Normal Fetal Ultrasounds or other Referrals:  Referred to Materal Fetal Medicine  Maternal Substance Abuse:  No Significant Maternal Medications:  None Significant Maternal Lab Results: None  No results found for this or any previous visit (from the past 24 hour(s)).  Patient Active Problem List   Diagnosis Date Noted  . Chronic hypertension in pregnancy 04/29/2019  . Supervision of high risk pregnancy, antepartum 12/01/2018  . Hypertension   . Previous cesarean section 02/17/2018  . Prediabetes 10/24/2017  . H/O: C-section 09/15/2017  . Gestational diabetes mellitus (GDM) affecting pregnancy, antepartum 09/15/2017  . Hirsutism 11/03/2015  . Morbid (severe) obesity due to excess calories ([place]) 11/03/2015    Assessment: Miranda Ruiz is a 39 y.o. G3P2002 at 74w0dhere for repeat Cesarean section -COVID pending -Blood work pending -IUD ordered for contraception   Plan: The risks of cesarean section were discussed with the patient including but were not limited to: bleeding which may require transfusion or reoperation; infection which may require antibiotics; injury to bowel, bladder, ureters or other surrounding organs; injury to the fetus; need for additional procedures including hysterectomy in the event of a  life-threatening hemorrhage; placental abnormalities wth subsequent pregnancies, incisional problems, thromboembolic phenomenon and other postoperative/anesthesia complications. The patient concurred with the proposed plan, giving informed written consent for the procedures.  Patient has been NPO since last night she will remain NPO for procedure. Anesthesia and OR aware.  Preoperative prophylactic antibiotics and SCDs ordered on call to the OR.  To OR when ready.   [place] DO  05/21/2019, 9:06 AM

## 2019-05-21 NOTE — Discharge Summary (Addendum)
Postpartum Discharge Summary  Patient Name: Miranda Ruiz DOB: [birth year] MRN: [id number]  Date of admission: 05/21/2019 Delivering Provider: Emily Filbert   Date of discharge: 05/23/2019  Admitting diagnosis: History of cesarean section [Z98.891] Cesarean delivery delivered [O82] Intrauterine pregnancy: [redacted]w[redacted]d    Secondary diagnosis:  Active Problems:   H/O: C-section   Gestational diabetes mellitus (GDM) affecting pregnancy, antepartum   Morbid (severe) obesity due to excess calories ([place]   Chronic hypertension in pregnancy   History of cesarean section   Cesarean delivery delivered  Additional problems: None     Discharge diagnosis: Term Pregnancy Delivered                                                                                                Post partum procedures: None  Augmentation: None  Complications: None  Hospital course:  Sceduled C/S   39y.o. yo G3P2002 at 376w0das admitted to the hospital 05/21/2019 for scheduled cesarean section with the following indication:Elective Repeat.  Membrane Rupture Time/Date: 11:40 AM ,05/21/2019   Patient delivered a Viable infant.05/21/2019  Details of operation can be found in separate operative note.  Patient had an uncomplicated postpartum course.  Fasting glucose improved: 87, blood pressures stable and normotensive. She is ambulating, tolerating a regular diet, passing flatus, and urinating well. Patient is discharged home in stable condition on  05/23/19        Delivery time: 11:41 AM   Magnesium Sulfate received: No BMZ received: No Rhophylac:N/A MMR:N/A Transfusion:No  Physical exam  Vitals:   05/22/19 0601 05/22/19 1530 05/22/19 2218 05/23/19 0543  BP: (!) 131/57 136/76 121/64 111/62  Pulse: 72 88 84 84  Resp: 16 20 20 16   Temp: 98.5 F (36.9 C) 98 F (36.7 C) 98.1 F (36.7 C) 98.9 F (37.2 C)  TempSrc: Oral  Oral Oral  SpO2: 97%   100%  Weight:      Height:       General: alert, cooperative and no  distress Lochia: appropriate Uterine Fundus: firm Incision: Dressing is clean, dry, and intact DVT Evaluation: No evidence of DVT seen on physical exam. No cords or calf tenderness. No significant calf/ankle edema. Labs: Lab Results  Component Value Date   WBC 7.0 05/22/2019   HGB 9.6 (L) 05/22/2019   HCT 29.5 (L) 05/22/2019   MCV 92.2 05/22/2019   PLT 183 05/22/2019   CMP Latest Ref Rng & Units 05/21/2019  Glucose 70 - 99 mg/dL 101(H)  BUN 6 - 20 mg/dL 8  Creatinine 0.44 - 1.00 mg/dL 0.65  Sodium 135 - 145 mmol/L 136  Potassium 3.5 - 5.1 mmol/L 4.5  Chloride 98 - 111 mmol/L 107  CO2 22 - 32 mmol/L 19(L)  Calcium 8.9 - 10.3 mg/dL 9.0  Total Protein 6.5 - 8.1 g/dL 6.3(L)  Total Bilirubin 0.3 - 1.2 mg/dL 1.2  Alkaline Phos 38 - 126 U/L 64  AST 15 - 41 U/L 35  ALT 0 - 44 U/L 15   Edinburgh Score: Edinburgh Postnatal Depression Scale Screening Tool 05/23/2019  I have been able to laugh and  see the funny side of things. 0  I have looked forward with enjoyment to things. 0  I have blamed myself unnecessarily when things went wrong. 0  I have been anxious or worried for no good reason. 1  I have felt scared or panicky for no good reason. 0  Things have been getting on top of me. 1  I have been so unhappy that I have had difficulty sleeping. 0  I have felt sad or miserable. 1  I have been so unhappy that I have been crying. 1  The thought of harming myself has occurred to me. 0  Edinburgh Postnatal Depression Scale Total 4    Discharge instruction: per After Visit Summary and "Baby and Me Booklet".  After visit meds:  Allergies as of 05/23/2019   No Known Allergies     Medication List    STOP taking these medications   aspirin EC 81 MG tablet   freestyle lancets   FREESTYLE TEST STRIPS test strip Generic drug: glucose blood   glucose monitoring kit monitoring kit   metFORMIN 500 MG tablet Commonly known as: GLUCOPHAGE     TAKE these medications    acetaminophen 500 MG tablet Commonly known as: TYLENOL Take 2 tablets (1,000 mg total) by mouth every 6 (six) hours.   Blood Pressure Kit Devi 1 Device by Does not apply route as needed. ICD 10 :  O09.90   ibuprofen 800 MG tablet Commonly known as: ADVIL Take 1 tablet (800 mg total) by mouth every 6 (six) hours.   multivitamin-prenatal 27-0.8 MG Tabs tablet Take 1 tablet by mouth daily.   oxyCODONE 5 MG immediate release tablet Commonly known as: Oxy IR/ROXICODONE Take 1 tablet (5 mg total) by mouth every 4 (four) hours as needed for up to 3 days for moderate pain, severe pain or breakthrough pain.       Diet: carb modified diet  Activity: Advance as tolerated. Pelvic rest for 6 weeks.   Outpatient follow up:4 weeks Follow up Appt: Future Appointments  Date Time Provider [place]  06/03/2019  1:30 PM [place] [facility]   Follow up Visit: Please schedule this patient for Postpartum visit in: 4 weeks with the following provider: MD (in person) For C/S patients schedule nurse incision check in weeks 2 weeks: yes High risk pregnancy complicated by: GDM, cHTN Delivery mode:  CS Anticipated Birth Control:  IUD placed post-placental PP Procedures needed: Incision check, 2 hour GTT Schedule Integrated BH visit: yes  Newborn Data: Live born female  Birth Weight:  3045g APGAR: 19, 9  Newborn Delivery   Birth date/time: 05/21/2019 11:41:00 Delivery type: Vaginal, Vacuum (Extractor) Trial of labor: No C-section categorization: Repeat      Baby Feeding: Breast Disposition:home with mother   05/23/2019 Chauncey Mann, MD

## 2019-05-21 NOTE — Op Note (Signed)
05/21/2019  12:05 PM  PATIENT:  Miranda Ruiz  39 y.o. female  PRE-OPERATIVE DIAGNOSIS:  Previous cesarean x 2, contraception, morbid obesity (BMI 58)  POST-OPERATIVE DIAGNOSIS:  same  PROCEDURE:  Procedure(s): CESAREAN SECTION (N/A)  SURGEON:  Surgeon(s) and Role:    * Cezar Misiaszek C, MD - Primary    * Sparacino, Hailey L, DO - Fellow  FINDINGS: Living female infant, Apgars 9 & 10  ANESTHESIA:   local and spinal  EBL:  Less than 500 cc by my estimation   BLOOD ADMINISTERED:none  DRAINS: Urinary Catheter (Foley) and Provena wound vac   LOCAL MEDICATIONS USED:  MARCAINE     SPECIMEN:  Source of Specimen:  cord blood  DISPOSITION OF SPECIMEN:  PATHOLOGY  COUNTS:  YES  TOURNIQUET:  * No tourniquets in log *  DICTATION: .Dragon Dictation  PLAN OF CARE: Admit to inpatient   PATIENT DISPOSITION:  PACU - hemodynamically stable.   Delay start of Pharmacological VTE agent (>24hrs) due to surgical blood loss or risk of bleeding: not applicable  The risks, benefits, and alternatives of surgery were explained, understood, accepted. Consents were signed. All questions were answered. In the operating room spinal anesthesia was applied without complication. Her abdomen and vagina were prepped and draped in the usual sterile fashion.  Traxi x 2 were placed to help manage her large panus. A Foley catheter was placed, draining clear urine throughout case. Timeout procedure was done. After adequate anesthesia was assured 30 mL for 0.5% Marcaine was injected into the subcutaneous tissue at the site of her previous cesarean. An incision was made above the previous incision. The incision was carried down through the subcutaneous tissue to the fascia. The fascia was scored the midline and extended bilaterally. The middle 50% of the rectus muscles were separated in a transverse fashion using electrosurgical technique. Excellent hemostasis was maintained. The peritoneum was entered with hemostats.  Peritoneal incision was extended bilaterally with the Bovie. The bladder blade was placed. A transverse incision was made on the well-developed lower uterine segment. The uterine incision was extended with traction on each side. Amniotomy was performed with a hemostat. Clear fluid was noted. The baby was delivered from a vertex presentation the mouth and nostrils were suctioned prior to delivery of the shoulders.  The baby's cord was clamped and cut and was transferred to the NICU personnel for routine care. The placenta was delivered intact with traction. A Liletta was placed. The uterus was left in situ and the interior was cleaned with a dry lap sponge. The uterine incision was closed with 2-0 Vicryl running locking suture. Excellent hemostasis was noted. By tilting the uterus each side was able to visualize the adnexa, and they were normal. The rectus fascia rectus muscles were noted be hemostatic as well. The fascia was closed with a #1 PDS loop in a running nonlocking fashion. No defects were palpable. The subcutaneous tissue was irrigated, clean, and dried. A subcuticular closure was done with a 3-0 Vicryl suture. Steri-Strips are placed. Excellent cosmetic results were obtained. She was taken to the recovery room in stable condition. She tolerated the procedure well.

## 2019-05-21 NOTE — Discharge Instructions (Signed)

## 2019-05-21 NOTE — Lactation Note (Signed)
This note was copied from a baby's chart. Lactation Consultation Note  Patient Name: Miranda Ruiz ASTMH'D Date: 05/21/2019 Reason for consult: Initial assessment;1st time breastfeeding P3, 11 hour female  ETI infant. Per mom, this is her first time breastfeeding she did not breastfeed her other two children. Mom's current feeding plan is breast and formula feeding infant. Mom with hx: CHTN, GDM, C/S and close space pregnancies have one year old at home.  Mom doesn't receive WIC and she doesn't have a breast pump at home.  LC gave mom a hand pump for prn, mom  was shown how to use hand pump & how to disassemble, clean, & reassemble parts. Mom latched infant on her  left breast using the football hold position, infant latched with depth, wide mouth nose and chin touching breast, sucking observed, infant breastfeed for 10 minutes. Infant was supplemented with 5 mls of Gerber gentle with iron using a  slow flow bottle nipple.  Mom has breastfeeding sheet and plans to supplement infant with formula after each feeding this is her choice. Mom knows to breastfeed infant according to hunger cues, 8 to 12 times within 24 hours and not exceed 3 hours without breastfeeding infant. Mom was doing STS with infant as LC was leaving the room. Mom knows to call RN or LC if she needs assistance with latching infant at breast. Reviewed Baby & Me book's Breastfeeding Basics.  LC discussed with mom breastfeeding support after leaving the hospital [facility] hotline, [facility] outpatient Clinic and [facility] online breastfeeding support group in Mother and Baby Booklet,    Maternal Data Formula Feeding for Exclusion: Yes Reason for exclusion: Mother's choice to formula and breast feed on admission Has patient been taught Hand Expression?: Yes Does the patient have breastfeeding experience prior to this delivery?: No  Feeding Feeding Type: Breast Fed  LATCH Score Latch: Grasps breast easily, tongue down, lips  flanged, rhythmical sucking.  Audible Swallowing: Spontaneous and intermittent  Type of Nipple: Everted at rest and after stimulation  Comfort (Breast/Nipple): Soft / non-tender  Hold (Positioning): Assistance needed to correctly position infant at breast and maintain latch.  LATCH Score: 9  Interventions Interventions: Breast feeding basics reviewed;Breast compression;Assisted with latch;Adjust position;Skin to skin;Support pillows;Breast massage;Position options;Hand express;Expressed milk;Hand pump  Lactation Tools Discussed/Used WIC Program: No Pump Review: Milk Storage Date initiated:: 05/22/19   Consult Status Consult Status: Follow-up Date: 05/22/19 Follow-up type: In-patient    Danelle Earthly 05/21/2019, 11:28 PM

## 2019-05-21 NOTE — Anesthesia Postprocedure Evaluation (Signed)
Anesthesia Post Note  Patient: Miranda Ruiz  Procedure(s) Performed: CESAREAN SECTION (N/A )     Patient location during evaluation: PACU Anesthesia Type: Spinal Level of consciousness: awake and alert Pain management: pain level controlled Vital Signs Assessment: post-procedure vital signs reviewed and stable Respiratory status: spontaneous breathing, nonlabored ventilation, respiratory function stable and patient connected to nasal cannula oxygen Cardiovascular status: blood pressure returned to baseline and stable Postop Assessment: no apparent nausea or vomiting Anesthetic complications: no    Last Vitals:  Vitals:   05/21/19 1343 05/21/19 1445  BP: (!) 145/63 125/64  Pulse: 71 60  Resp: 16 15  Temp: 36.5 C (!) 36.4 C  SpO2: 98% 99%    Last Pain:  Vitals:   05/21/19 1445  TempSrc: Oral  PainSc: 0-No pain   Pain Goal: Patients Stated Pain Goal: 4 (05/21/19 0909)                 Shaina Gullatt L [employer]es Agrusa

## 2019-05-21 NOTE — Transfer of Care (Signed)
Immediate Anesthesia Transfer of Care Note  Patient: Miranda Ruiz  Procedure(s) Performed: CESAREAN SECTION (N/A )  Patient Location: PACU  Anesthesia Type:Spinal  Level of Consciousness: awake, alert  and oriented  Airway & Oxygen Therapy: Patient Spontanous Breathing  Post-op Assessment: Report given to RN and Post -op Vital signs reviewed and stable  Post vital signs: Reviewed and stable BP 123/61, HR 80, RR 24, SaO2 96%  Last Vitals:  Vitals Value Taken Time  BP    Temp    Pulse    Resp    SpO2      Last Pain:  Vitals:   05/21/19 0909  TempSrc: Oral  PainSc: 0-No pain      Patients Stated Pain Goal: 4 (05/21/19 0909)  Complications: No apparent anesthesia complications

## 2019-05-22 LAB — CBC
HCT: 29.5 % — ABNORMAL LOW (ref 36.0–46.0)
Hemoglobin: 9.6 g/dL — ABNORMAL LOW (ref 12.0–15.0)
MCH: 30 pg (ref 26.0–34.0)
MCHC: 32.5 g/dL (ref 30.0–36.0)
MCV: 92.2 fL (ref 80.0–100.0)
Platelets: 183 10*3/uL (ref 150–400)
RBC: 3.2 MIL/uL — ABNORMAL LOW (ref 3.87–5.11)
RDW: 12.7 % (ref 11.5–15.5)
WBC: 7 10*3/uL (ref 4.0–10.5)
nRBC: 0 % (ref 0.0–0.2)

## 2019-05-22 LAB — GLUCOSE, CAPILLARY: Glucose-Capillary: 114 mg/dL — ABNORMAL HIGH (ref 70–99)

## 2019-05-22 LAB — BIRTH TISSUE RECOVERY COLLECTION (PLACENTA DONATION)

## 2019-05-22 NOTE — Lactation Note (Signed)
This note was copied from a baby's chart. Lactation Consultation Note  Patient Name: Miranda Ruiz KDTOI'Z Date: 05/22/2019   Mom is a G3.  Baby Miranda Paganelli now 53 hours old.  Early term infant named [place]. First time bf.  Mom reports she plans to breastfeed at the next feeding.  Mom asked about mixing breastmilk and formula together.  Told her that was okay, but I encouraged moms to always give their pumped or hand expressed milk first since they worked to get it.  Mom voiced understanding.  Discussed pumping with DEBP with mom.  Mom says she has thought about it.  Priased any breastfeeding.  Mom denies need at this time.  Urged to call lactation as needed.   Maternal Data    Feeding Feeding Type: Formula Nipple Type: Slow - flow  LATCH Score                   Interventions    Lactation Tools Discussed/Used     Consult Status      Miranda Ruiz 05/22/2019, 3:26 PM

## 2019-05-22 NOTE — Progress Notes (Signed)
POSTPARTUM PROGRESS NOTE  Subjective: Miranda Ruiz is a 39 y.o. G3P3003 s/p LTCS at [redacted]w[redacted]d and DM2.  She reports she doing well. No acute events overnight. She denies any problems with ambulating, voiding or po intake. Denies nausea or vomiting. She has not passed flatus. Pain is well controlled.  Lochia is equal to menses.  Objective: Blood pressure (!) 131/57, pulse 72, temperature 98.5 F (36.9 C), temperature source Oral, resp. rate 16, height 5\' 4"  (1.626 m), weight (!) 153.3 kg, last menstrual period 09/04/2018, SpO2 97 %, unknown if currently breastfeeding.  Physical Exam:  General: alert, cooperative and no distress Chest: no respiratory distress Abdomen: soft, non-tender  Uterine Fundus: firm, appropriately tender Extremities: No calf swelling or tenderness  No edema  Recent Labs    05/21/19 1410 05/22/19 0536  HGB 10.5* 9.6*  HCT 31.9* 29.5*    Assessment/Plan: Miranda Ruiz is a 39 y.o. G3P3003 s/p  at [redacted]w[redacted]d for .  Routine Postpartum Care: Doing well, pain well-controlled.  -- Continue routine care, lactation support  -- Contraception: IUD -- Feeding: Formula attempting breast feed  Dispo: Plan for discharge tomorrow.

## 2019-05-22 NOTE — Progress Notes (Signed)
RN notified MD of low urinary output 13ml/hr on this shift. Per MD, no interventions necessary at this time, keep foley in place for now. MD will assess during rounds. RN will continue to monitor.   Patrica Duel, RN 05/22/2019 4:41 AM

## 2019-05-23 LAB — GLUCOSE, CAPILLARY: Glucose-Capillary: 87 mg/dL (ref 70–99)

## 2019-05-23 MED ORDER — IBUPROFEN 800 MG PO TABS: 800.0000 mg | ORAL_TABLET | Freq: Four times a day (QID) | ORAL | 0 refills | Status: DC

## 2019-05-23 MED ORDER — ACETAMINOPHEN 500 MG PO TABS: 1000.0000 mg | ORAL_TABLET | Freq: Four times a day (QID) | ORAL | 0 refills | Status: DC

## 2019-05-23 MED ORDER — FERROUS SULFATE 325 (65 FE) MG PO TABS
325.0000 mg | ORAL_TABLET | ORAL | Status: DC
  Administered 2019-05-23: 11:00:00 325 mg via ORAL
  Filled 2019-05-23: qty 1

## 2019-05-23 MED ORDER — OXYCODONE HCL 5 MG PO TABS: 5.0000 mg | ORAL_TABLET | ORAL | 0 refills | Status: AC | PRN

## 2019-05-23 NOTE — Lactation Note (Signed)
This note was copied from a baby's chart. Lactation Consultation Note  Patient Name: Miranda Ruiz [id number] Date: 05/23/2019 Reason for consult: Follow-up assessment  P3 mother whose infant is now 94 hours old.  This is an ETI at 37+0 weeks.  Mother's feeding preference is breast/bottle.  Mother has only formula fed since delivery.  She states "I am going to keep on working on it."  She has a manual pump but not a DEBP for home use.  She is not a [facility] participant.  Mother had no questions/concerns.  She has our OP phone number for questions after discharge.  Father present.  Family has been discharged.   Maternal Data    Feeding Feeding Type: Formula Nipple Type: Slow - flow  LATCH Score                   Interventions    Lactation Tools Discussed/Used     Consult Status Consult Status: Complete Date: 05/24/19 Follow-up type: In-patient    Dora Sims 05/23/2019, 5:38 PM

## 2019-05-24 LAB — CULTURE, BETA STREP (GROUP B ONLY): Strep Gp B Culture: NEGATIVE

## 2019-05-27 DIAGNOSIS — Z029 Encounter for administrative examinations, unspecified: Secondary | ICD-10-CM

## 2019-05-28 ENCOUNTER — Telehealth: Payer: Medicaid - Out of State | Admitting: Obstetrics & Gynecology

## 2019-05-29 ENCOUNTER — Inpatient Hospital Stay (HOSPITAL_COMMUNITY)
Admission: EM | Admit: 2019-05-29 | Discharge: 2019-05-31 | DRG: 776 | Disposition: A | Discharge status: Home / Self Care | Payer: PRIVATE HEALTH INSURANCE | Attending: Obstetrics & Gynecology | Admitting: Obstetrics & Gynecology

## 2019-05-29 ENCOUNTER — Encounter (HOSPITAL_COMMUNITY): Payer: Self-pay | Admitting: Emergency Medicine

## 2019-05-29 ENCOUNTER — Ambulatory Visit (HOSPITAL_COMMUNITY)
Admission: EM | Admit: 2019-05-29 | Discharge: 2019-05-29 | Disposition: A | Discharge status: Home / Self Care | Payer: PRIVATE HEALTH INSURANCE | Source: Home / Self Care

## 2019-05-29 ENCOUNTER — Other Ambulatory Visit: Payer: Self-pay

## 2019-05-29 ENCOUNTER — Emergency Department (HOSPITAL_COMMUNITY): Payer: PRIVATE HEALTH INSURANCE

## 2019-05-29 DIAGNOSIS — O1093 Unspecified pre-existing hypertension complicating the puerperium: Secondary | ICD-10-CM | POA: Diagnosis present

## 2019-05-29 DIAGNOSIS — O115 Pre-existing hypertension with pre-eclampsia, complicating the puerperium: Secondary | ICD-10-CM | POA: Diagnosis not present

## 2019-05-29 DIAGNOSIS — Z87891 Personal history of nicotine dependence: Secondary | ICD-10-CM | POA: Diagnosis not present

## 2019-05-29 DIAGNOSIS — I159 Secondary hypertension, unspecified: Secondary | ICD-10-CM

## 2019-05-29 DIAGNOSIS — R0602 Shortness of breath: Secondary | ICD-10-CM | POA: Diagnosis present

## 2019-05-29 DIAGNOSIS — O1003 Pre-existing essential hypertension complicating the puerperium: Secondary | ICD-10-CM | POA: Diagnosis present

## 2019-05-29 DIAGNOSIS — O165 Unspecified maternal hypertension, complicating the puerperium: Secondary | ICD-10-CM | POA: Diagnosis present

## 2019-05-29 DIAGNOSIS — G4730 Sleep apnea, unspecified: Secondary | ICD-10-CM | POA: Diagnosis present

## 2019-05-29 LAB — COMPREHENSIVE METABOLIC PANEL
ALT: 18 U/L (ref 0–44)
AST: 21 U/L (ref 15–41)
Albumin: 2.9 g/dL — ABNORMAL LOW (ref 3.5–5.0)
Alkaline Phosphatase: 55 U/L (ref 38–126)
Anion gap: 11 (ref 5–15)
BUN: 8 mg/dL (ref 6–20)
CO2: 21 mmol/L — ABNORMAL LOW (ref 22–32)
Calcium: 8.8 mg/dL — ABNORMAL LOW (ref 8.9–10.3)
Chloride: 110 mmol/L (ref 98–111)
Creatinine, Ser: 0.76 mg/dL (ref 0.44–1.00)
GFR calc Af Amer: >60 mL/min (ref >60)
GFR calc non Af Amer: >60 mL/min (ref >60)
Glucose, Bld: 84 mg/dL (ref 70–99)
Potassium: 3.9 mmol/L (ref 3.5–5.1)
Sodium: 142 mmol/L (ref 135–145)
Total Bilirubin: 0.6 mg/dL (ref 0.3–1.2)
Total Protein: 6 g/dL — ABNORMAL LOW (ref 6.5–8.1)

## 2019-05-29 LAB — CBC
HCT: 33.5 % — ABNORMAL LOW (ref 36.0–46.0)
Hemoglobin: 10.6 g/dL — ABNORMAL LOW (ref 12.0–15.0)
MCH: 29.4 pg (ref 26.0–34.0)
MCHC: 31.6 g/dL (ref 30.0–36.0)
MCV: 93.1 fL (ref 80.0–100.0)
Platelets: 249 10*3/uL (ref 150–400)
RBC: 3.6 MIL/uL — ABNORMAL LOW (ref 3.87–5.11)
RDW: 12.3 % (ref 11.5–15.5)
WBC: 5.4 10*3/uL (ref 4.0–10.5)
nRBC: 0 % (ref 0.0–0.2)

## 2019-05-29 LAB — URINALYSIS, ROUTINE W REFLEX MICROSCOPIC
Bacteria, UA: NONE SEEN
Bilirubin Urine: NEGATIVE
Glucose, UA: NEGATIVE mg/dL
Ketones, ur: NEGATIVE mg/dL
Leukocytes,Ua: NEGATIVE
Nitrite: NEGATIVE
Protein, ur: NEGATIVE mg/dL
RBC / HPF: >50 RBC/hpf — ABNORMAL HIGH (ref 0–5)
Specific Gravity, Urine: 1.017 (ref 1.005–1.030)
pH: 7 (ref 5.0–8.0)

## 2019-05-29 LAB — BRAIN NATRIURETIC PEPTIDE: B Natriuretic Peptide: 179.5 pg/mL — ABNORMAL HIGH (ref 0.0–100.0)

## 2019-05-29 MED ORDER — LABETALOL HCL 5 MG/ML IV SOLN: 10.0000 mg | Freq: Once | INTRAVENOUS | Status: DC | PRN

## 2019-05-29 MED ORDER — MAGNESIUM SULFATE 40 GM/1000ML IV SOLN
2.0000 g/h | INTRAVENOUS | Status: AC
  Administered 2019-05-30 (×2): 2 g/h via INTRAVENOUS
  Filled 2019-05-29 (×2): qty 1000

## 2019-05-29 MED ORDER — HYDROCHLOROTHIAZIDE 25 MG PO TABS
25.0000 mg | ORAL_TABLET | Freq: Every day | ORAL | Status: DC
  Administered 2019-05-30 – 2019-05-31 (×2): 25 mg via ORAL
  Filled 2019-05-29 (×2): qty 1

## 2019-05-29 MED ORDER — LACTATED RINGERS IV SOLN: INTRAVENOUS | Status: DC

## 2019-05-29 MED ORDER — ACETAMINOPHEN 325 MG PO TABS
650.0000 mg | ORAL_TABLET | Freq: Four times a day (QID) | ORAL | Status: DC | PRN
  Administered 2019-05-30 (×2): 650 mg via ORAL
  Filled 2019-05-29 (×2): qty 2

## 2019-05-29 MED ORDER — ENOXAPARIN SODIUM 80 MG/0.8ML ~~LOC~~ SOLN
0.5000 mg/kg | SUBCUTANEOUS | Status: DC
  Administered 2019-05-30 (×2): 75 mg via SUBCUTANEOUS
  Filled 2019-05-29 (×2): qty 0.8

## 2019-05-29 MED ORDER — AMLODIPINE BESYLATE 10 MG PO TABS
10.0000 mg | ORAL_TABLET | Freq: Every day | ORAL | Status: DC
  Administered 2019-05-30 – 2019-05-31 (×3): 10 mg via ORAL
  Filled 2019-05-29 (×3): qty 1

## 2019-05-29 MED ORDER — MAGNESIUM SULFATE BOLUS VIA INFUSION
4.0000 g | Freq: Once | INTRAVENOUS | Status: AC
  Administered 2019-05-30: 01:00:00 4 g via INTRAVENOUS
  Filled 2019-05-29: qty 1000

## 2019-05-29 MED ORDER — IBUPROFEN 800 MG PO TABS
800.0000 mg | ORAL_TABLET | Freq: Three times a day (TID) | ORAL | Status: DC | PRN
  Administered 2019-05-30 (×3): 800 mg via ORAL
  Filled 2019-05-29 (×3): qty 1

## 2019-05-29 MED ORDER — ENOXAPARIN SODIUM 80 MG/0.8ML ~~LOC~~ SOLN: 0.5000 mg/kg | SUBCUTANEOUS | Status: DC

## 2019-05-29 MED ORDER — FUROSEMIDE 10 MG/ML IJ SOLN
40.0000 mg | Freq: Two times a day (BID) | INTRAMUSCULAR | Status: AC
  Administered 2019-05-30 (×2): 40 mg via INTRAVENOUS
  Filled 2019-05-29 (×3): qty 4

## 2019-05-29 NOTE — Lactation Note (Signed)
Lactation Consultation Note  Patient Name: Miranda Ruiz [id number] Date: 05/29/2019 Reason for consult: Follow-up assessment;Other (Comment)(PP readmit)  Visited with mom of a 1 days old ETI female, mom is a PP readmit due to shortness of breath. RN Duwayne Heck called LC because mom told her she's taking Oxycodone and she's been pumping and dumping, because that's what GOB told her, GOB is an Char[employer] fundraiser.  LC reviewed Oxycodone safety during BF, and it's a L3. Mom stated she's only taking it 1-2 times/day and baby is mostly getting formula anyway, she's only pumping about twice a day and she's been dumping it instead of giving it to baby. Discussed with mom benefits and risks of offering her breastmilk to baby, and she decided that since she's also mixing her breastmilk with formula, she feels comfortable giving it to baby, LC agreeable with mom's decision.  RN Danielle set up a DEBP, mom will start pumping tonight, pumping schedule of q 3 hours was recommended, but mom will go at her own pace since she won't be fully BF. Discussed breastmilk storage guidelines, engorgement prevention and treatment and asked mom to keep the milk "moving" in order to prevent engorgement and plugged ducts. Mom aware of LC OP services and will call PRN.   Maternal Data    Feeding    LATCH Score                   Interventions Interventions: Breast feeding basics reviewed;DEBP  Lactation Tools Discussed/Used Tools: Pump Breast pump type: Double-Electric Breast Pump Pump Review: Setup, frequency, and cleaning Initiated by:: RN Danielle Date initiated:: 05/29/19   Consult Status Consult Status: PRN Follow-up type: In-patient    Glory Graefe Venetia Constable 05/29/2019, 10:57 PM

## 2019-05-29 NOTE — ED Triage Notes (Signed)
Pt c/o dyspnea, "crackling breathing" when laying down at night, swelling in bilat hands and feet over past 5 days.  Has also had a wound vac in place for surgical incision that pt discontinued at home due to having problems with it.  Discussed with Marilynn Rail, NP - per provider instruction, informed pt she should be seen in ED based on sxs and hx.  Pt states she went to ED yesterday, but left due to long wait.  Highly encouraged pt to wait until she is fully evaluated in ED.  Pt verbalized understanding of all and agreed to go to ED.

## 2019-05-29 NOTE — ED Triage Notes (Signed)
C/o SOB that is worse at night when lying down and swelling in bilateral hands and feet since Tuesday night.  Also reports pressure in bilateral ears.  Pt had a c-section on 2/12.  Went to [facility] ED last night and states BP was elevated.  Had EKG and lab work but didn't stay for results.  States she went to sit in car because she didn't want COVID exposure and LWBS.  Denies chest pain.  Reports hx of preeclampsia after giving birth in 2019.

## 2019-05-29 NOTE — ED Provider Notes (Signed)
[facility] Emergency Department Provider Note MRN:  [id number]  Arrival date & time: 05/29/19     Chief Complaint   Shortness of Breath   History of Present Illness   Miranda Ruiz is a 39 y.o. year-old female with a history of preeclampsia presenting to the ED with chief complaint of shortness of breath.  Patient delivered a child via C-section on 2-12.  4 days later began experiencing some shortness of breath, dyspnea exertion, has noticed some continued swelling to the hands and feet.  Feeling a pressure-like sensation in her head.  Denies chest pain, no abdominal pain, feels like her C-section site is healing well.  Symptoms constant, no exacerbating or relieving factors.  Moderate in severity.  Review of Systems  A complete 10 system review of systems was obtained and all systems are negative except as noted in the HPI and PMH.   Patient's Health History    Past Medical History:  Diagnosis Date  . Gestational diabetes   . Hypertension    postpartum after 2nd child    Past Surgical History:  Procedure Laterality Date  . APPENDECTOMY    . CESAREAN SECTION    . CESAREAN SECTION N/A 02/17/2018   Procedure: CESAREAN SECTION;  Surgeon: Levie Heritage, DO;  Location: [facility] BIRTHING SUITES;  Service: Obstetrics;  Laterality: N/A;  . CESAREAN SECTION N/A 05/21/2019   Procedure: CESAREAN SECTION;  Surgeon: Allie Bossier, MD;  Location: [facility] LD ORS;  Service: Obstetrics;  Laterality: N/A;    Family History  Problem Relation Age of Onset  . Cancer Maternal Aunt     Social History   Socioeconomic History  . Marital status: Single    Spouse name: Not on file  . Number of children: Not on file  . Years of education: Not on file  . Highest education level: Not on file  Occupational History  . Not on file  Tobacco Use  . Smoking status: Former Smoker    Types: Cigarettes    Quit date: 05/18/2017    Years since quitting: 2.0  . Smokeless tobacco: Never Used   Substance and Sexual Activity  . Alcohol use: Not Currently    Comment: occasionally  . Drug use: Never  . Sexual activity: Yes    Birth control/protection: None  Other Topics Concern  . Not on file  Social History Narrative  . Not on file   Social Determinants of Health   Financial Resource Strain:   . Difficulty of Paying Living Expenses: Not on file  Food Insecurity: Food Insecurity Present  . Worried About Programme researcher, broadcasting/film/video in the Last Year: Sometimes true  . Ran Out of Food in the Last Year: Sometimes true  Transportation Needs:   . Lack of Transportation (Medical): Not on file  . Lack of Transportation (Non-Medical): Not on file  Physical Activity:   . Days of Exercise per Week: Not on file  . Minutes of Exercise per Session: Not on file  Stress:   . Feeling of Stress : Not on file  Social Connections:   . Frequency of Communication with Friends and Family: Not on file  . Frequency of Social Gatherings with Friends and Family: Not on file  . Attends Religious Services: Not on file  . Active Member of Clubs or Organizations: Not on file  . Attends Banker Meetings: Not on file  . Marital Status: Not on file  Intimate Partner Violence:   . Fear of  Current or Ex-Partner: Not on file  . Emotionally Abused: Not on file  . Physically Abused: Not on file  . Sexually Abused: Not on file     Physical Exam   Vitals:   05/29/19 1700 05/29/19 1715  BP: (!) 148/77 (!) 157/80  Pulse:    Resp: (!) 22 (!) 23  Temp:    SpO2:      CONSTITUTIONAL: Well-appearing, NAD NEURO:  Alert and oriented x 3, no focal deficits EYES:  eyes equal and reactive ENT/NECK:  no LAD, no JVD CARDIO: Regular rate, well-perfused, normal S1 and S2 PULM:  CTAB no wheezing or rhonchi GI/GU:  normal bowel sounds, non-distended, non-tender MSK/SPINE:  No gross deformities, no edema SKIN:  no rash, atraumatic PSYCH:  Appropriate speech and behavior  *Additional and/or pertinent  findings included in MDM below  Diagnostic and Interventional Summary    EKG Interpretation  Date/Time:  Saturday May 29 2019 15:48:07 EST Ventricular Rate:  73 PR Interval:  150 QRS Duration: 102 QT Interval:  403 QTC Calculation: 445 R Axis:   50 Text Interpretation: Sinus rhythm Low voltage, precordial leads Confirmed by Kennis Carina [phone]) on 05/29/2019 4:17:53 PM      Cardiac Monitoring Interpretation:  Labs Reviewed  CBC - Abnormal; Notable for the following components:      Result Value   RBC 3.60 (*)    Hemoglobin 10.6 (*)    HCT 33.5 (*)    All other components within normal limits  COMPREHENSIVE METABOLIC PANEL - Abnormal; Notable for the following components:   CO2 21 (*)    Calcium 8.8 (*)    Total Protein 6.0 (*)    Albumin 2.9 (*)    All other components within normal limits  URINALYSIS, ROUTINE W REFLEX MICROSCOPIC - Abnormal; Notable for the following components:   APPearance HAZY (*)    Hgb urine dipstick LARGE (*)    RBC / HPF >50 (*)    All other components within normal limits  BRAIN NATRIURETIC PEPTIDE - Abnormal; Notable for the following components:   B Natriuretic Peptide 179.5 (*)    All other components within normal limits    DG Chest 2 View  Final Result      Medications  labetalol (NORMODYNE) injection 10 mg (has no administration in time range)     Procedures  /  Critical Care Procedures  ED Course and Medical Decision Making  I have reviewed the triage vital signs, the nursing notes, and pertinent available records from the EMR.  Pertinent labs & imaging results that were available during my care of the patient were reviewed by me and considered in my medical decision making (see below for details).     Suspicious for recurrence of preeclampsia given the hypertension on arrival, seems to have self resolved but will treat with labetalol if continues to be hypertensive.  Also considering pregnancy related cardiomyopathy.  No  evidence of DVT, doubt PE.  Awaiting labs, urinalysis, will likely touch base with OB/GYN.  6:42 PM update: Discussed case with Dr. Shawnie Pons of OB/GYN, who will evaluate the patient and admit.  Elmer Sow. Pilar Plate, MD [facility] [email or web address]  Final Clinical Impressions(s) / ED Diagnoses     ICD-10-CM   1. SOB (shortness of breath)  R06.02   2. Secondary hypertension  I15.9     ED Discharge Orders    None       Discharge Instructions Discussed with and Provided  to Patient:   Discharge Instructions   None       Maudie Flakes, MD 05/29/19 [phone]

## 2019-05-30 MED ORDER — CYCLOBENZAPRINE HCL 10 MG PO TABS
10.0000 mg | ORAL_TABLET | Freq: Three times a day (TID) | ORAL | Status: DC | PRN
  Administered 2019-05-30 – 2019-05-31 (×2): 10 mg via ORAL
  Filled 2019-05-30 (×3): qty 1

## 2019-05-30 NOTE — H&P (Signed)
Chief Complaint   Shortness of Breath   History of Present Illness   Miranda Ruiz is a 39 y.o. year-old female with a history of preeclampsia presenting to the ED with chief complaint of shortness of breath.  Patient delivered a child via C-section on 2-12. 4 days later began experiencing some shortness of breath, dyspnea exertion, has noticed some continued swelling to the hands and feet. Feeling a pressure-like sensation in her head. Denies chest pain, no abdominal pain, feels like her C-section site is healing well. Symptoms constant, no exacerbating or relieving factors. Moderate in severity.  Review of Systems  A complete 10 system review of systems was obtained and all systems are negative except as noted in the HPI and PMH.  Patient's Health History        Past Medical History:  Diagnosis Date  . Gestational diabetes   . Hypertension    postpartum after 2nd child        Past Surgical History:  Procedure Laterality Date  . APPENDECTOMY    . CESAREAN SECTION    . CESAREAN SECTION N/A 02/17/2018   Procedure: CESAREAN SECTION; Surgeon: Levie Heritage, DO; Location: [facility] BIRTHING SUITES; Service: Obstetrics; Laterality: N/A;  . CESAREAN SECTION N/A 05/21/2019   Procedure: CESAREAN SECTION; Surgeon: Allie Bossier, MD; Location: [facility] LD [facility]S; Service: Obstetrics; Laterality: N/A;        Family History  Problem Relation Age of Onset  . Cancer Maternal Aunt    Social History        Socioeconomic History  . Marital status: Single    Spouse name: Not on file  . Number of children: Not on file  . Years of education: Not on file  . Highest education level: Not on file  Occupational History  . Not on file  Tobacco Use  . Smoking status: Former Smoker    Types: Cigarettes    Quit date: 05/18/2017    Years since quitting: 2.0  . Smokeless tobacco: Never Used  Substance and Sexual Activity  . Alcohol use: Not Currently    Comment: occasionally  . Drug use: Never  . Sexual  activity: Yes    Birth control/protection: None  Other Topics Concern  . Not on file  Social History Narrative  . Not on file   Social Determinants of Health      Financial Resource Strain:   . Difficulty of Paying Living Expenses: Not on file  Food Insecurity: Food Insecurity Present  . Worried About Programme researcher, broadcasting/film/video in the Last Year: Sometimes true  . Ran Out of Food in the Last Year: Sometimes true  Transportation Needs:   . Lack of Transportation (Medical): Not on file  . Lack of Transportation (Non-Medical): Not on file  Physical Activity:   . Days of Exercise per Week: Not on file  . Minutes of Exercise per Session: Not on file  Stress:   . Feeling of Stress : Not on file  Social Connections:   . Frequency of Communication with Friends and Family: Not on file  . Frequency of Social Gatherings with Friends and Family: Not on file  . Attends Religious Services: Not on file  . Active Member of Clubs or Organizations: Not on file  . Attends Banker Meetings: Not on file  . Marital Status: Not on file  Intimate Partner Violence:   . Fear of Current or Ex-Partner: Not on file  . Emotionally Abused: Not on file  . Physically  Abused: Not on file  . Sexually Abused: Not on file   Physical Exam       Vitals:   05/29/19 1700 05/29/19 1715  BP: (!) 148/77 (!) 157/80  Pulse:    Resp: (!) 22 (!) 23  Temp:    SpO2:     CONSTITUTIONAL: Well-appearing, NAD  NEURO: Alert and oriented x 3, no focal deficits  EYES: eyes equal and reactive  ENT/NECK: no LAD, no JVD  CARDIO: Regular rate, well-perfused, normal S1 and S2  PULM: CTAB no wheezing or rhonchi  GI/GU: normal bowel sounds, non-distended, non-tender  MSK/SPINE: No gross deformities, no edema  SKIN: no rash, atraumatic  PSYCH: Appropriate speech and behavior  *Additional and/or pertinent findings included in MDM below  Diagnostic and Interventional Summary        EKG Interpretation  Date/Time:  Saturday May 29 2019 15:48:07 EST  Ventricular Rate: 73  PR Interval: 150  QRS Duration: 102  QT Interval: 403  QTC Calculation: 445  R Axis: 50  Text Interpretation: Sinus rhythm Low voltage, precordial leads Confirmed by Gerlene Fee [phone]) on 05/29/2019 4:17:53 PM       Cardiac Monitoring Interpretation:       Labs Reviewed  CBC - Abnormal; Notable for the following components:  Result Value    RBC 3.60 (*)    Hemoglobin 10.6 (*)    HCT 33.5 (*)    All other components within normal limits  COMPREHENSIVE METABOLIC PANEL - Abnormal; Notable for the following components:   CO2 21 (*)    Calcium 8.8 (*)    Total Protein 6.0 (*)    Albumin 2.9 (*)    All other components within normal limits  URINALYSIS, ROUTINE W REFLEX MICROSCOPIC - Abnormal; Notable for the following components:   APPearance HAZY (*)    Hgb urine dipstick LARGE (*)    RBC / HPF >50 (*)    All other components within normal limits  BRAIN NATRIURETIC PEPTIDE - Abnormal; Notable for the following components:   B Natriuretic Peptide 179.5 (*)    All other components within normal limits   DG Chest 2 View  Final Result     Medications  labetalol (NORMODYNE) injection 10 mg (has no administration in time range)   Procedures / Critical Care  Procedures  ED Course and Medical Decision Making  I have reviewed the triage vital signs, the nursing notes, and pertinent available records from the EMR.  Pertinent labs & imaging results that were available during my care of the patient were reviewed by me and considered in my medical decision making (see below for details).   Suspicious for recurrence of preeclampsia given the hypertension on arrival, seems to have self resolved but will treat with labetalol if continues to be hypertensive. Also considering pregnancy related cardiomyopathy. No evidence of DVT, doubt PE. Awaiting labs, urinalysis, will likely touch base with OB/GYN.  6:42 PM update: Discussed  case with Dr. Kennon Rounds of OB/GYN, who will evaluate the patient and admit.  Barth Kirks. Sedonia Small, [place]  [email or web address]  Final Clinical Impressions(s) / ED Diagnoses     ICD-10-CM   1. SOB (shortness of breath)  R06.02   2. Secondary hypertension  I15.9       ED Discharge Orders     None      Discharge Instructions Discussed with and Provided to Patient:   Discharge Instructions   None  Sabas Sous, MD  05/29/19 1842    >Postpartum pre eclampsia with increased afterload and pulmonary edema >Does not appear to be LV hypo functioning/cardiomyopathy at this point  Aggressive diuresis and BP control Magnesium prophylaxis  Attestation of Attending Supervision of Advanced Practitioner (CNM/NP/PA): Evaluation and management procedures were performed by the Advanced Practitioner under my supervision and collaboration. I have reviewed the Advanced Practitioner's note and chart, and I agree with the management and plan.  Rockne Coons MD Attending Physician for the Center for [facility]

## 2019-05-30 NOTE — Progress Notes (Signed)
Subjective: Postpartum Day 9: Cesarean Delivery Patient reports incisional pain and no problems voiding.    Objective: Vital signs in last 24 hours: Temp:  [98 F (36.7 C)-98.8 F (37.1 C)] 98 F (36.7 C) (02/21 0700) Pulse Rate:  [69-102] 87 (02/21 0700) Resp:  [16-34] 24 (02/21 0700) BP: (133-176)/(68-107) 151/83 (02/21 0700) SpO2:  [94 %-100 %] 98 % (02/21 0700) Weight:  [149.7 kg-149.8 kg] 149.8 kg (02/21 0700)  Physical Exam:  General: alert, cooperative and no distress  Lungs crackles in both lungs, L>R, no consolidation Lochia: appropriate Uterine Fundus: firm Incision: healing well DVT Evaluation: No evidence of DVT seen on physical exam.  Recent Labs    05/29/19 1640  HGB 10.6*  HCT 33.5*    Assessment/Plan: Postpartum pre eclampsia with  pulmonary edema  O2 sat 100% on RA and patient is not working at all.  She also has sleep apnea Not consistent with cardiomyopathy at this point, appears to be due to increased afterload and fluid shifts associated with pregnancy and Caesarean section  Will continue diuresis and BP control which should resolve this clinically Magnesium off tonight Do not think ECHO is of utility at this point in her course  Lazaro Arms 05/30/2019, 7:03 AM

## 2019-05-30 NOTE — Progress Notes (Signed)
Pt's has occasional sinus tachycardia with heart rate as high as 148bpm while patient is ambulating in room. At rest, pt's baseline HR comes back down to 80s to low 100s. RN checked on patient each time to find pt asymptomatic during each episode. Pt states she is "fine." Pt denies chest pain, shortness of breath and weakness.

## 2019-05-31 MED ORDER — AMLODIPINE BESYLATE 10 MG PO TABS: 10.0000 mg | ORAL_TABLET | Freq: Every day | ORAL | 2 refills | Status: AC

## 2019-05-31 MED ORDER — FUROSEMIDE 10 MG/ML IJ SOLN
40.0000 mg | Freq: Once | INTRAMUSCULAR | Status: AC
  Administered 2019-05-31: 40 mg via INTRAVENOUS
  Filled 2019-05-31: qty 4

## 2019-05-31 MED ORDER — HYDROCHLOROTHIAZIDE 25 MG PO TABS: 25.0000 mg | ORAL_TABLET | Freq: Every day | ORAL | 2 refills | Status: DC

## 2019-05-31 NOTE — Discharge Summary (Signed)
Physician Discharge Summary  Patient ID: Miranda Ruiz MRN: [id number] DOB/AGE: 1980-12-20 39 y.o.  Admit date: 05/29/2019 Discharge date: 05/31/2019  Admission Diagnoses:  PP pre eclampsia with pulmonary edema due to postpartum fluid shifts and increased afterload  Discharge Diagnoses:  Active Problems:   Postpartum hypertension   Pre-eclampsia superimposed on chronic hypertension, postpartum   Discharged Condition: good  Hospital Course: pt responded well to the IV lasix and BP meds  To this point she has lost 10 pounds of fluid and is getting 1 more dose of lasix  Consults: None  Significant Diagnostic Studies: labs:   Treatments: fluid diuresis  Discharge Exam: Blood pressure 136/81, pulse 95, temperature 97.9 F (36.6 C), temperature source Oral, resp. rate 20, height 5' 4"  (1.626 m), weight (!) 145.6 kg, last menstrual period 09/04/2018, SpO2 100 %, unknown if currently breastfeeding. General appearance: alert, cooperative and no distress Resp: clear to auscultation bilaterally and mild crackles left base GI: soft, non-tender; bowel sounds normal; no masses,  no organomegaly  Disposition: Discharge disposition: 01-Home or Self Care       Discharge Instructions    Activity as tolerated   Complete by: As directed    Ambulatory referral to Lactation   Complete by: As directed    Reason for consult: The Mother-Infant Dyad Needs Assistance in the Continuation of Breastfeeding   Call MD for:  persistant nausea and vomiting   Complete by: As directed    Call MD for:  severe uncontrolled pain   Complete by: As directed    Call MD for:  temperature >100.4   Complete by: As directed    Diet - low sodium heart healthy   Complete by: As directed    Sexual activity   Complete by: As directed    No sex 6 weeks     Allergies as of 05/31/2019   No Known Allergies     Medication List    STOP taking these medications   acetaminophen 500 MG tablet Commonly known as:  TYLENOL     TAKE these medications   amLODipine 10 MG tablet Commonly known as: NORVASC Take 1 tablet (10 mg total) by mouth daily.   Blood Pressure Kit Devi 1 Device by Does not apply route as needed. ICD 10 :  O09.90   hydrochlorothiazide 25 MG tablet Commonly known as: HYDRODIURIL Take 1 tablet (25 mg total) by mouth daily.   ibuprofen 800 MG tablet Commonly known as: ADVIL Take 1 tablet (800 mg total) by mouth every 6 (six) hours. What changed:   when to take this  reasons to take this   multivitamin-prenatal 27-0.8 MG Tabs tablet Take 1 tablet by mouth every evening.   oxyCODONE 5 MG immediate release tablet Commonly known as: Oxy IR/ROXICODONE Take 5 mg by mouth every 4 (four) hours as needed (pain).      Follow-up Information    Florian Buff, MD Follow up in 1 week(s).   Specialties: Obstetrics and Gynecology, Radiology Why: BP check and exam Contact information: [place] [address] [phone]           Signed: Florian Buff 05/31/2019, 6:54 AM

## 2019-05-31 NOTE — Discharge Instructions (Signed)
Postpartum Hypertension Postpartum hypertension is high blood pressure that remains higher than normal after childbirth. You may not realize that you have postpartum hypertension if your blood pressure is not being checked regularly. In most cases, postpartum hypertension will go away on its own, usually within a week of delivery. However, for some women, medical treatment is required to prevent serious complications, such as seizures or stroke. What are the causes? This condition may be caused by one or more of the following:  Hypertension that existed before pregnancy (chronic hypertension).  Hypertension that comes on as a result of pregnancy (gestational hypertension).  Hypertensive disorders during pregnancy (preeclampsia) or seizures in women who have high blood pressure during pregnancy (eclampsia).  A condition in which the liver, platelets, and red blood cells are damaged during pregnancy (HELLP syndrome).  A condition in which the thyroid produces too much hormones (hyperthyroidism).  Other rare problems of the nerves (neurological disorders) or blood disorders. In some cases, the cause may not be known. What increases the risk? The following factors may make you more likely to develop this condition:  Chronic hypertension. In some cases, this may not have been diagnosed before pregnancy.  Obesity.  Type 2 diabetes.  Kidney disease.  History of preeclampsia or eclampsia.  Other medical conditions that change the level of hormones in the body (hormonal imbalance). What are the signs or symptoms? As with all types of hypertension, postpartum hypertension may not have any symptoms. Depending on how high your blood pressure is, you may experience:  Headaches. These may be mild, moderate, or severe. They may also be steady, constant, or sudden in onset (thunderclap headache).  Changes in your ability to see (visual changes).  Dizziness.  Shortness of breath.  Swelling  of your hands, feet, lower legs, or face. In some cases, you may have swelling in more than one of these locations.  Heart palpitations or a racing heartbeat.  Difficulty breathing while lying down.  Decrease in the amount of urine that you pass. Other rare signs and symptoms may include:  Sweating more than usual. This lasts longer than a few days after delivery.  Chest pain.  Sudden dizziness when you get up from sitting or lying down.  Seizures.  Nausea or vomiting.  Abdominal pain. How is this diagnosed? This condition may be diagnosed based on the results of a physical exam, blood pressure measurements, and blood and urine tests. You may also have other tests, such as a CT scan or an MRI, to check for other problems of postpartum hypertension. How is this treated? If blood pressure is high enough to require treatment, your options may include:  Medicines to reduce blood pressure (antihypertensives). Tell your health care provider if you are breastfeeding or if you plan to breastfeed. There are many antihypertensive medicines that are safe to take while breastfeeding.  Stopping medicines that may be causing hypertension.  Treating medical conditions that are causing hypertension.  Treating the complications of hypertension, such as seizures, stroke, or kidney problems. Your health care provider will also continue to monitor your blood pressure closely until it is within a safe range for you. Follow these instructions at home:  Take over-the-counter and prescription medicines only as told by your health care provider.  Return to your normal activities as told by your health care provider. Ask your health care provider what activities are safe for you.  Do not use any products that contain nicotine or tobacco, such as cigarettes and e-cigarettes. If   you need help quitting, ask your health care provider.  Keep all follow-up visits as told by your health care provider. This  is important. Contact a health care provider if:  Your symptoms get worse.  You have new symptoms, such as: ? A headache that does not get better. ? Dizziness. ? Visual changes. Get help right away if:  You suddenly develop swelling in your hands, ankles, or face.  You have sudden, rapid weight gain.  You develop difficulty breathing, chest pain, racing heartbeat, or heart palpitations.  You develop severe pain in your abdomen.  You have any symptoms of a stroke. "BE FAST" is an easy way to remember the main warning signs of a stroke: ? B - Balance. Signs are dizziness, sudden trouble walking, or loss of balance. ? E - Eyes. Signs are trouble seeing or a sudden change in vision. ? F - Face. Signs are sudden weakness or numbness of the face, or the face or eyelid drooping on one side. ? A - Arms. Signs are weakness or numbness in an arm. This happens suddenly and usually on one side of the body. ? S - Speech. Signs are sudden trouble speaking, slurred speech, or trouble understanding what people say. ? T - Time. Time to call emergency services. Write down what time symptoms started.  You have other signs of a stroke, such as: ? A sudden, severe headache with no known cause. ? Nausea or vomiting. ? Seizure. These symptoms may represent a serious problem that is an emergency. Do not wait to see if the symptoms will go away. Get medical help right away. Call your local emergency services (911 in the [place] Do not drive yourself to the hospital. Summary  Postpartum hypertension is high blood pressure that remains higher than normal after childbirth.  In most cases, postpartum hypertension will go away on its own, usually within a week of delivery.  For some women, medical treatment is required to prevent serious complications, such as seizures or stroke. This information is not intended to replace advice given to you by your health care provider. Make sure you discuss any questions  you have with your health care provider. Document Revised: 05/01/2018 Document Reviewed: 01/13/2017 Elsevier Patient Education  2020 [employer]  

## 2019-06-03 ENCOUNTER — Telehealth: Payer: Medicaid - Out of State | Admitting: Obstetrics and Gynecology

## 2019-06-03 ENCOUNTER — Ambulatory Visit: Payer: Medicaid - Out of State

## 2019-06-10 ENCOUNTER — Telehealth: Payer: Medicaid - Out of State | Admitting: Obstetrics and Gynecology

## 2019-06-15 ENCOUNTER — Other Ambulatory Visit: Payer: Self-pay

## 2019-06-15 ENCOUNTER — Ambulatory Visit (INDEPENDENT_AMBULATORY_CARE_PROVIDER_SITE_OTHER): Payer: Medicaid Other | Admitting: General Practice

## 2019-06-15 VITALS — BP 127/76 | HR 82 | Ht 64.0 in | Wt 316.0 lb

## 2019-06-15 DIAGNOSIS — Z0130 Encounter for examination of blood pressure without abnormal findings: Secondary | ICD-10-CM

## 2019-06-15 DIAGNOSIS — Z5189 Encounter for other specified aftercare: Secondary | ICD-10-CM

## 2019-06-15 NOTE — Progress Notes (Signed)
Patient presents to office today for wound & BP check following repeat c-section on 2/12. Patient has hx of chronic HTN. Patient reports taking norvasc & HCTZ daily but forgot to take medication today. BP 127/76 today.  Incision is clean, dry & intact. Wound care and signs & symptoms of infection reviewed with patient. Patient will follow up at pp visit on 3/22.  Chase Caller RN BSN 06/15/19

## 2019-06-15 NOTE — Progress Notes (Signed)
Patient seen and assessed by nursing staff during this encounter. I have reviewed the chart and agree with the documentation and plan.  Joselyn Arrow, MD 06/15/2019 3:50 PM

## 2019-06-23 ENCOUNTER — Other Ambulatory Visit: Payer: Self-pay

## 2019-06-23 NOTE — Addendum Note (Signed)
Addended by: [name] on: 06/23/2019 02:14 PM   Modules accepted: Orders

## 2019-06-24 ENCOUNTER — Other Ambulatory Visit: Payer: Medicaid - Out of State

## 2019-06-24 ENCOUNTER — Ambulatory Visit: Payer: Medicaid - Out of State | Admitting: Certified Nurse Midwife

## 2019-06-28 ENCOUNTER — Other Ambulatory Visit: Payer: Self-pay

## 2019-06-28 ENCOUNTER — Other Ambulatory Visit: Payer: Medicaid Other

## 2019-06-28 ENCOUNTER — Ambulatory Visit (INDEPENDENT_AMBULATORY_CARE_PROVIDER_SITE_OTHER): Payer: PRIVATE HEALTH INSURANCE | Admitting: Family Medicine

## 2019-06-28 ENCOUNTER — Encounter: Payer: Self-pay | Admitting: Family Medicine

## 2019-06-28 DIAGNOSIS — T8332XA Displacement of intrauterine contraceptive device, initial encounter: Secondary | ICD-10-CM

## 2019-06-28 DIAGNOSIS — O165 Unspecified maternal hypertension, complicating the puerperium: Secondary | ICD-10-CM

## 2019-06-28 DIAGNOSIS — O99893 Other specified diseases and conditions complicating puerperium: Secondary | ICD-10-CM

## 2019-06-28 NOTE — Progress Notes (Signed)
Subjective:     Miranda Ruiz is a 39 y.o. female who presents for a postpartum visit. She is 5 weeks postpartum following a Repeat Cesarean Low. I have fully reviewed the prenatal and intrapartum course. The delivery was at 37 gestational weeks. Outcome: repeat cesarean section, low transverse incision. Anesthesia: spinal. Postpartum course has been unremarkable. Baby's course has been unremarkable. Baby is feeding by bottle - Similac Advance. Bleeding staining only. Bowel function is normal. Bladder function is normal. Patient is not sexually active. Contraception method is IUD. Postpartum depression screening: Negative.  Having orthostasis.  The following portions of the patient's history were reviewed and updated as appropriate: allergies, current medications, past family history, past medical history, past social history, past surgical history and problem list.  Review of Systems Pertinent items are noted in HPI.   Objective:   BP 128/81   Pulse 80   Wt (!) 321 lb (145.6 kg)   LMP 09/04/2018 (Exact Date)   BMI 55.10 kg/m     LMP 09/04/2018 (Exact Date)   General:  alert, cooperative and no distress  Lungs: clear to auscultation bilaterally  Heart:  regular rate and rhythm, S1, S2 normal, no murmur, click, rub or gallop  Abdomen: soft, non-tender; bowel sounds normal; no masses,  no organomegaly   Vulva:  normal  Vagina: normal vagina, no discharge, exudate, lesion, or erythema  Cervix:  no IUD strings seen.        Assessment:     normal postpartum exam. CHTN with postpartum HTN. GDM. Pap smear 09/15/2017.  Plan:    1. Contraception: IUD - strings not visualized. Will get [name]. 2. Stop HCTZ, continue amlodipine. F/u with PCP in next month. 3. PP 2hr GTT  4. Follow up in: 1 year or as needed.

## 2019-06-29 LAB — GLUCOSE TOLERANCE, 2 HOURS
Glucose, 2 hour: 135 mg/dL (ref 65–139)
Glucose, GTT - Fasting: 108 mg/dL — ABNORMAL HIGH (ref 65–99)

## 2019-07-05 ENCOUNTER — Ambulatory Visit (HOSPITAL_COMMUNITY): Payer: PRIVATE HEALTH INSURANCE

## 2019-07-16 ENCOUNTER — Ambulatory Visit (HOSPITAL_COMMUNITY)
Admission: RE | Admit: 2019-07-16 | Discharge: 2019-07-16 | Disposition: A | Discharge status: Home / Self Care | Payer: PRIVATE HEALTH INSURANCE | Source: Ambulatory Visit | Attending: Family Medicine | Admitting: Family Medicine

## 2019-07-16 ENCOUNTER — Other Ambulatory Visit: Payer: Self-pay

## 2019-07-16 DIAGNOSIS — T8332XA Displacement of intrauterine contraceptive device, initial encounter: Secondary | ICD-10-CM | POA: Insufficient documentation

## 2019-07-21 ENCOUNTER — Telehealth: Payer: Self-pay

## 2019-07-21 NOTE — Telephone Encounter (Signed)
Patient Called to get the results from her ultrasound earlier this week to ensure that her IUD was still in the correct place. Wants a call back to discuss her ultrasound results.

## 2019-07-23 ENCOUNTER — Telehealth (INDEPENDENT_AMBULATORY_CARE_PROVIDER_SITE_OTHER): Payer: PRIVATE HEALTH INSURANCE

## 2019-07-23 DIAGNOSIS — Z975 Presence of (intrauterine) contraceptive device: Secondary | ICD-10-CM

## 2019-07-23 NOTE — Telephone Encounter (Signed)
Pt called wanting to know her results of [name], is her IUD in place.

## 2019-07-26 NOTE — Telephone Encounter (Signed)
Called patient & informed her of normal u/s results showing IUD in correct spot. Patient verbalized understanding.

## 2019-09-25 IMAGING — US US MFM FETAL BPP W/O NON-STRESS
1 series · 14 of 28 positions shown · non-contrast
Comparison: none

[Series 1: us mfm fetal bpp w/o non-stress · 55 acquisitions, 14 frames shown]
[im 3/55]
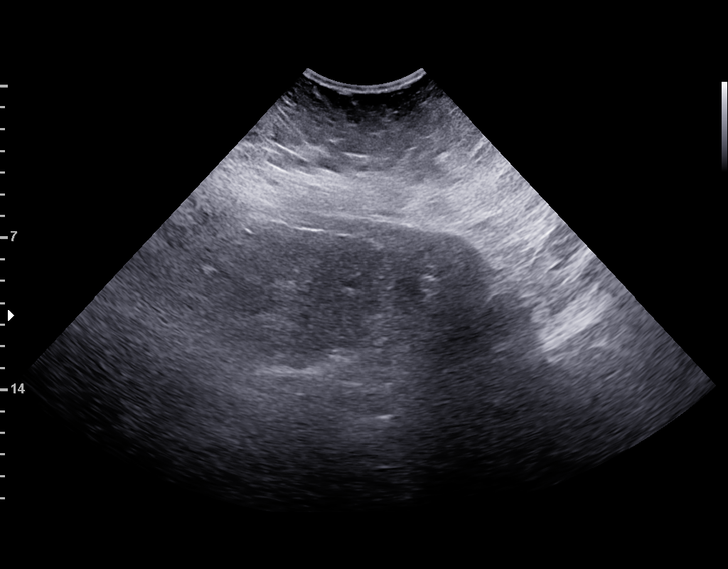
[im 7/55]
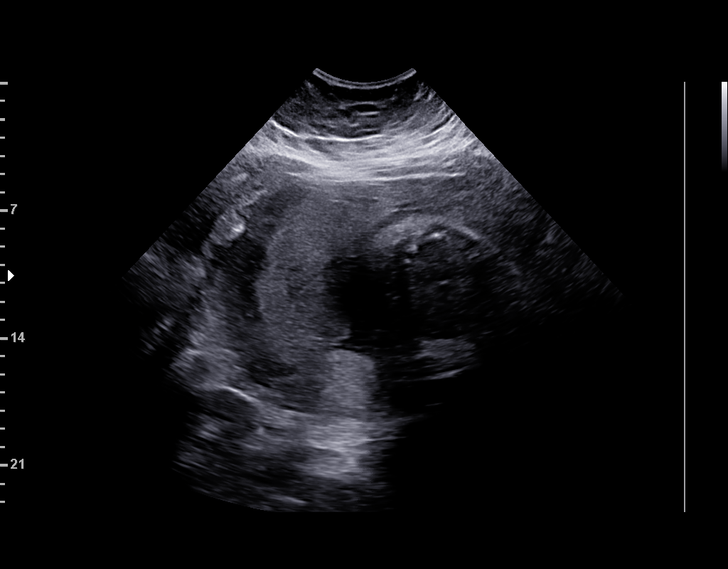
[im 11/55]
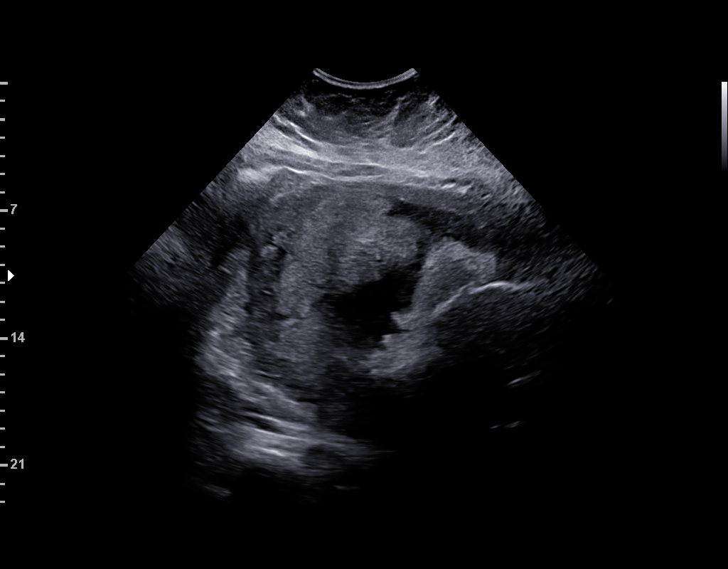
[im 15/55]
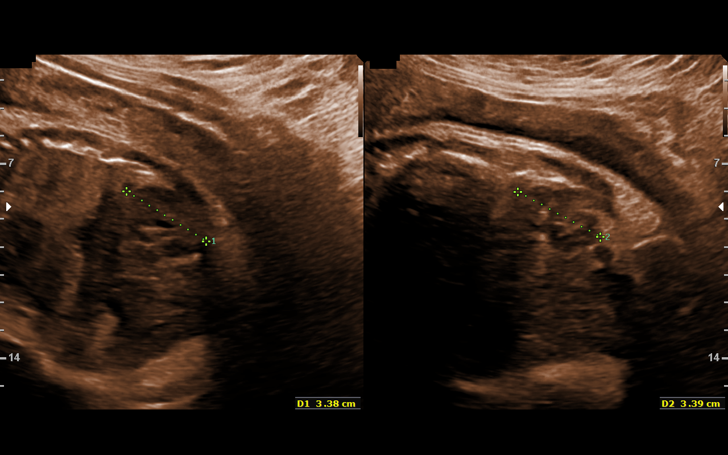
[im 19/55]
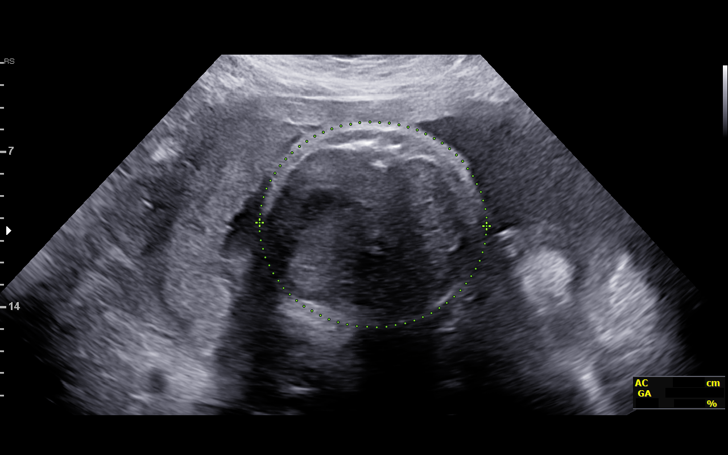
[im 23/55]
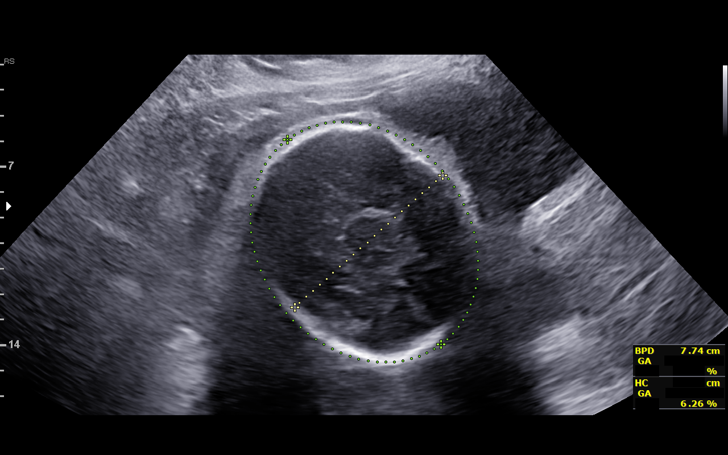
[im 27/55]
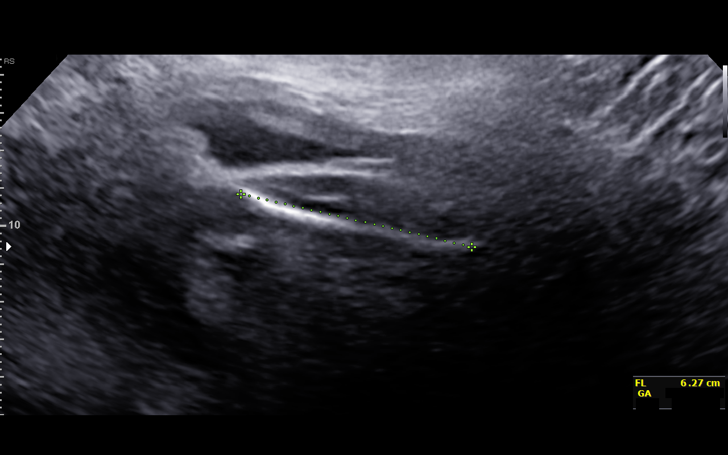
[im 31/55]
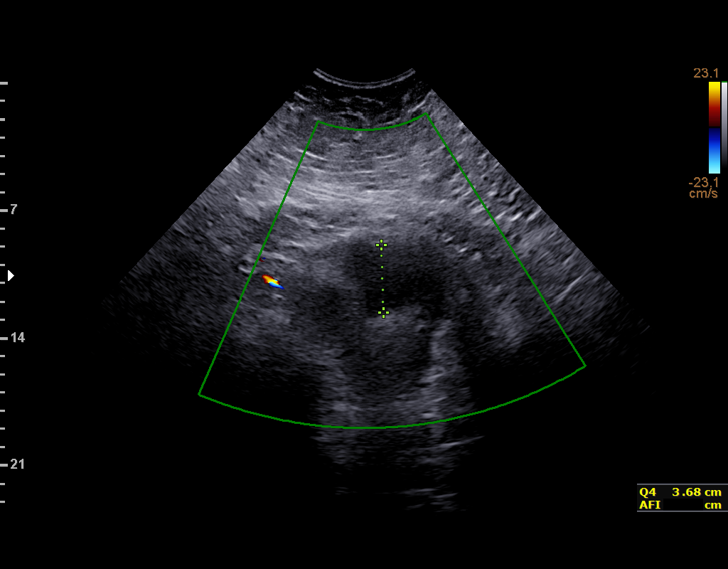
[im 35/55]
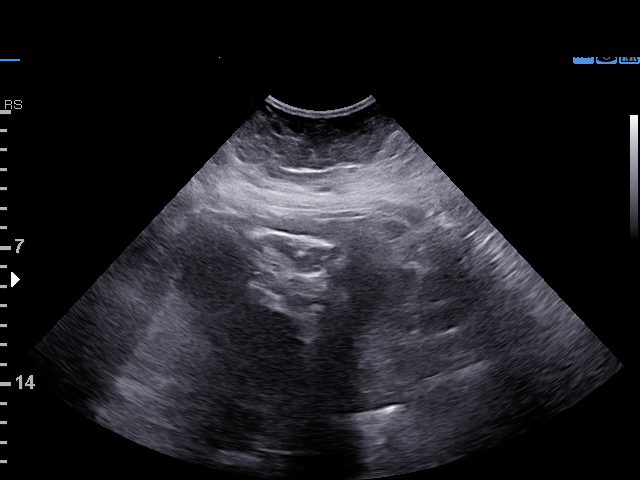
[im 39/55]
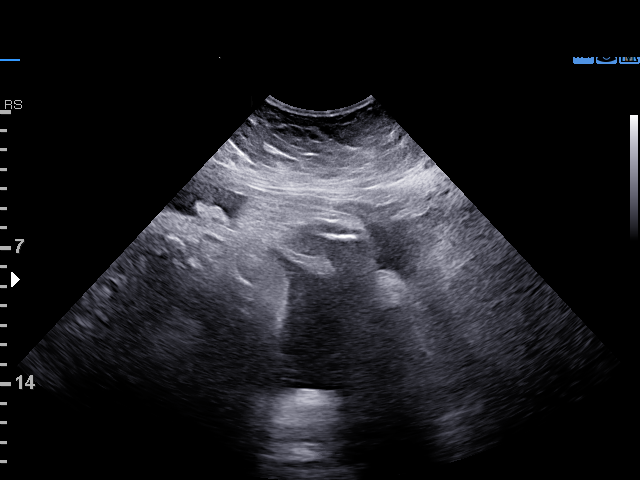
[im 43/55]
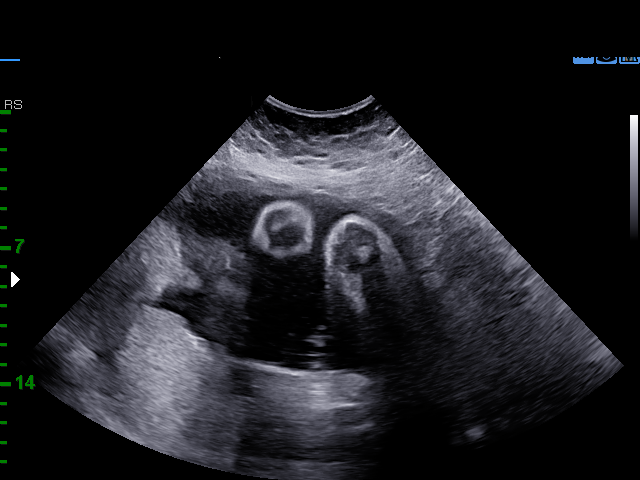
[im 47/55]
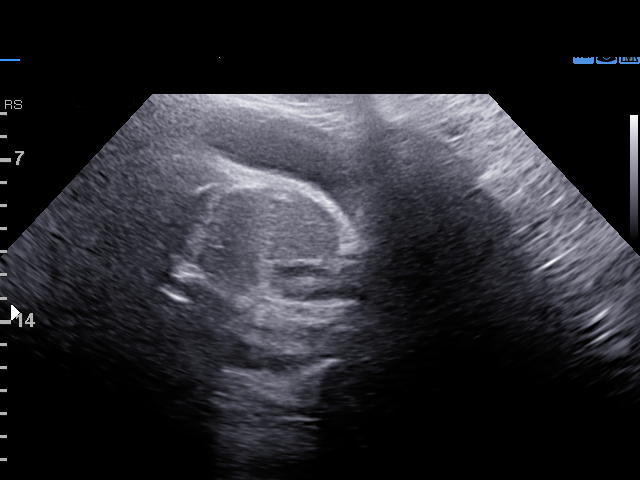
[im 51/55]
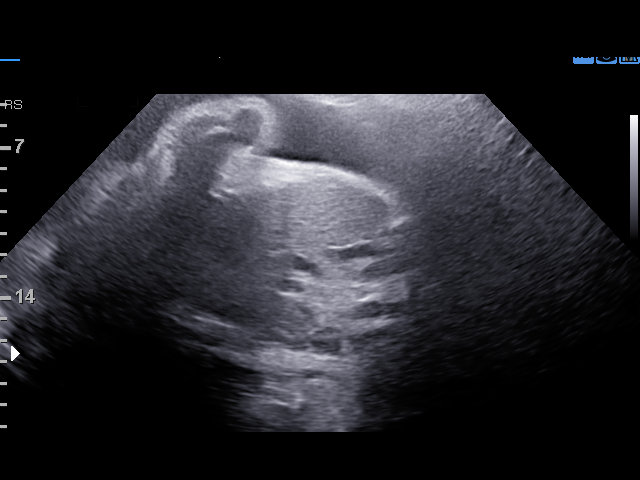
[im 55/55]
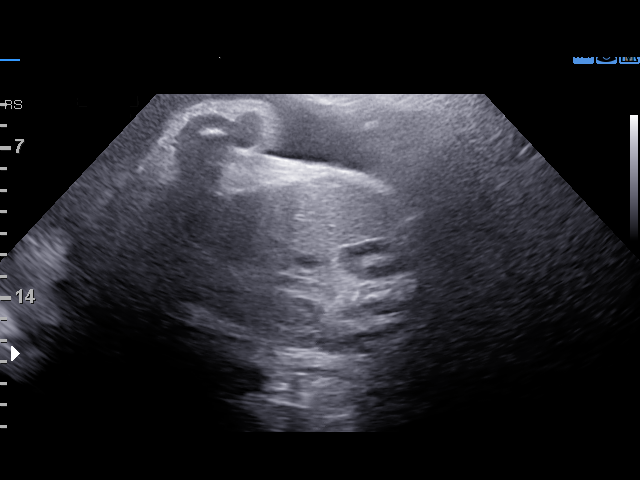

[14 of 28 positions shown; findings below may reference images not displayed]

[facility]/Gyn Clinic

Indications

Maternal morbid obesity (BMI 56)
Gestational diabetes in pregnancy, diet
controlled (poorly controlled)
Encounter for other antenatal screening
follow-up
Poor obstetric history: Previous gestational
diabetes - insulin ([DATE]-Hgb A1c
Prediabetes-diet)
32 weeks gestation of pregnancy
History of cesarean delivery, currently
pregnant
Advanced maternal age multigravida 35+,
second trimester (Neg AFP, low risk NIPs)
Vital Signs

Height:        5'3"
Fetal Evaluation

Num Of Fetuses:         1
Fetal Heart Rate(bpm):  132
Cardiac Activity:       Observed
Presentation:           Transverse, head to maternal left
Placenta:               Posterior
P. Cord Insertion:      Previously Visualized
Amniotic Fluid
AFI FV:      Within normal limits

AFI Sum(cm)     %Tile       Largest Pocket(cm)
22.39           86

RUQ(cm)       RLQ(cm)       LUQ(cm)        LLQ(cm)
5.98
Biophysical Evaluation

Amniotic F.V:   Pocket => 2 cm two         F. Tone:        Observed
planes
F. Movement:    Observed                   Score:          [DATE]
F. Breathing:   Observed
Biometry

BPD:      78.2  mm     G. Age:  31w 3d         17  %    CI:        72.08   %    70 - 86
FL/HC:      21.4   %    19.1 -
HC:      293.1  mm     G. Age:  32w 2d         17  %    HC/AC:      0.96        0.96 -
AC:      306.4  mm     G. Age:  34w 4d         96  %    FL/BPD:     80.3   %    71 - 87
FL:       62.8  mm     G. Age:  32w 4d         44  %    FL/AC:      20.5   %    20 - 24

Est. FW:    3377  gm    4 lb 14 oz      77  %
OB History

Gravidity:    2         Term:   1
Living:       1
Gestational Age

LMP:           32w 2d        Date:  05/18/17                 EDD:   02/22/18
U/S Today:     32w 5d                                        EDD:   02/19/18
Best:          32w 2d     Det. By:  LMP  (05/18/17)          EDD:   02/22/18
Anatomy

Cranium:               Appears normal         Aortic Arch:            Not well visualized
Cavum:                 Previously seen        Ductal Arch:            Not well visualized
Ventricles:            Previously seen        Diaphragm:              Previously seen
Choroid Plexus:        Previously seen        Stomach:                Appears normal, left
sided
Cerebellum:            Previously seen        Abdomen:                Appears normal
Posterior Fossa:       Previously seen        Abdominal Wall:         Previously seen
Nuchal Fold:           Not applicable (>20    Cord Vessels:           Previously seen
wks GA)
Face:                  Orbits and profile     Kidneys:                Appear normal
previously seen
Lips:                  Previously seen        Bladder:                Appears normal
Thoracic:              Previously seen        Spine:                  Previously seen
Heart:                 Previously seen        Upper Extremities:      Previously seen
RVOT:                  Previously seen        Lower Extremities:      Previously seen
LVOT:                  Previously seen

Other:  Male gender prev seen. Technically difficult due to maternal habitus
and fetal position.
Cervix Uterus Adnexa
Cervix
Not visualized (advanced GA >12wks)

Uterus
No abnormality visualized.

Left Ovary
Not visualized.

Right Ovary
Not visualized.

Adnexa
No abnormality visualized. No adnexal mass
visualized.
Comments

U/S images reviewed. Findings reviewed with patient.
Appropriate fetal growth is noted.  No fetal abnormalities are
seen.  No evidence of fetal compromise is found on BPP
today.
Patient is not checking her BS.
Questions answered.
10 minutes spent face to face with patient.
Recommendations: 1) Serial U/S every 4 weeks for fetal
growth 2) Weekly BPP
Recommendations

1) Serial U/S every 4 weeks for fetal growth 2) Weekly BPP

## 2019-10-26 IMAGING — US US MFM FETAL BPP W/ NON-STRESS
1 series · 14 of 28 positions shown · non-contrast
Comparison: none

[Series 1: us mfm fetal bpp w/ non-stress · 28 acquisitions, 14 frames shown]
[im 2/28]
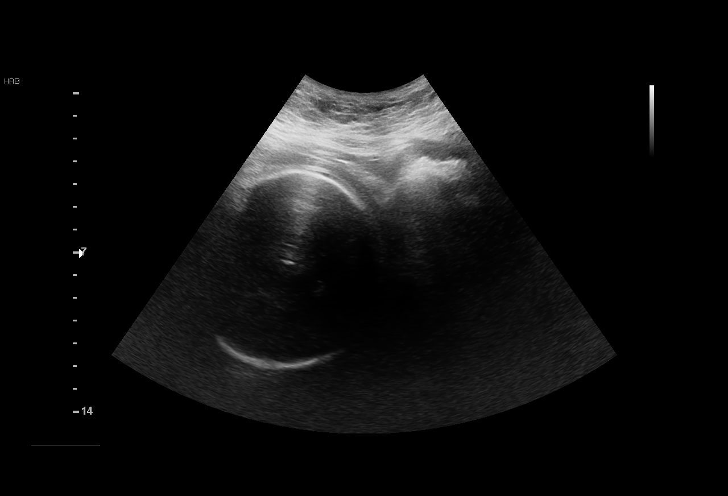
[im 4/28]
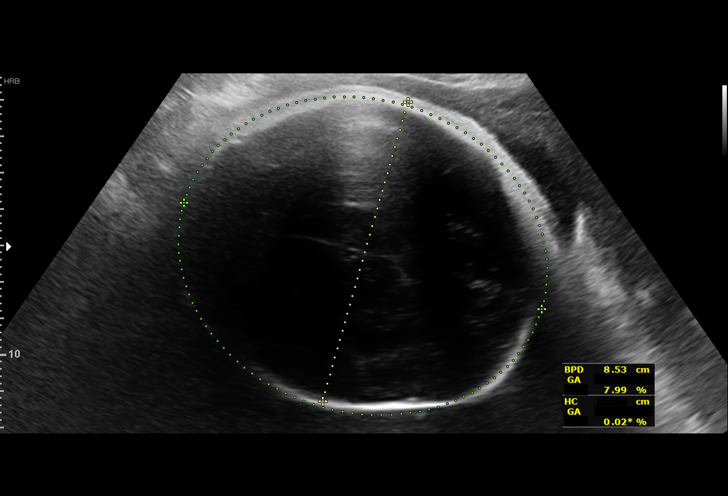
[im 6/28]
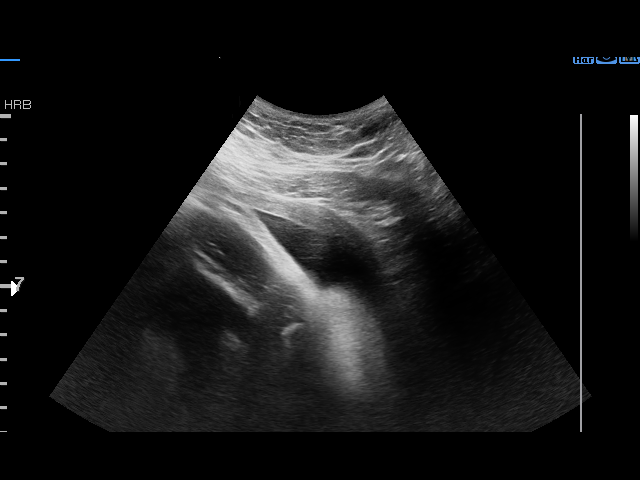
[im 8/28]
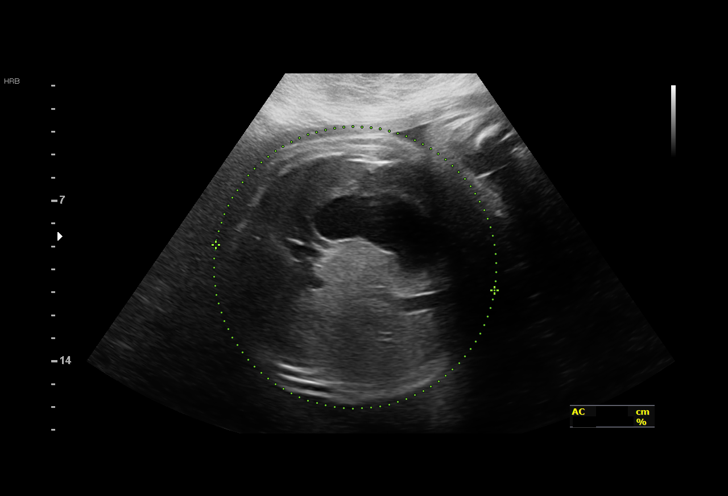
[im 10/28]
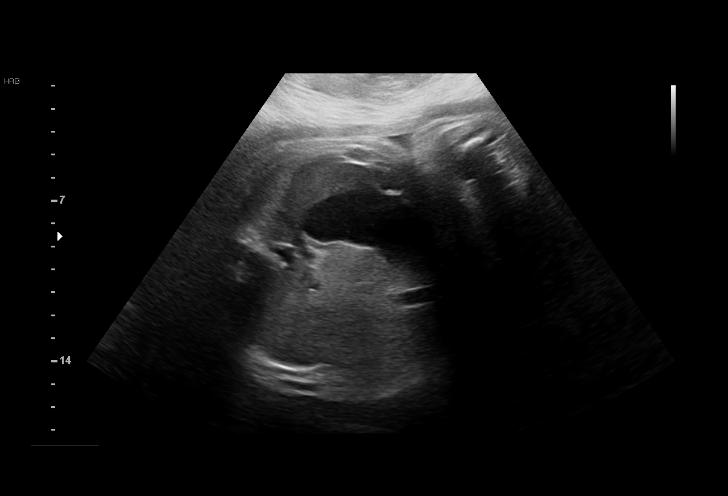
[im 12/28]
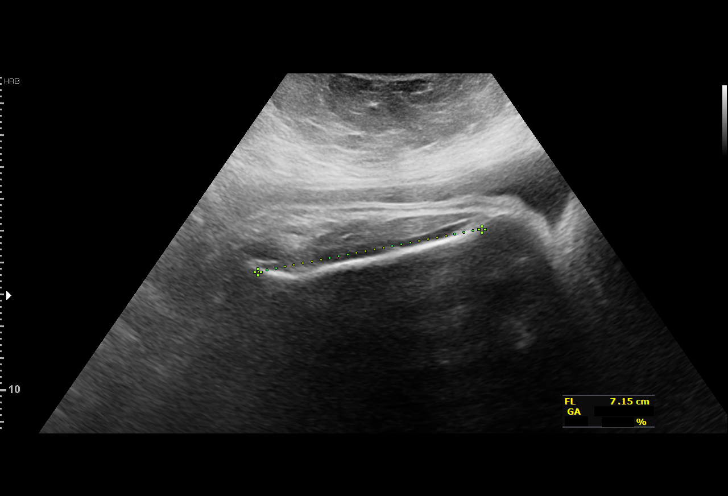
[im 14/28]
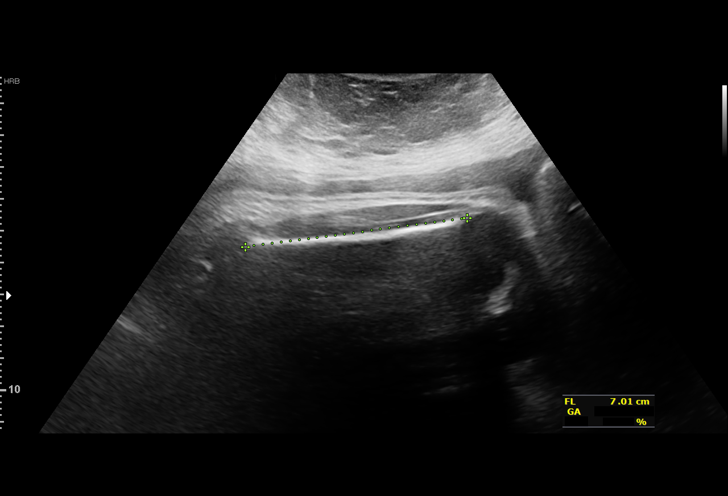
[im 16/28]
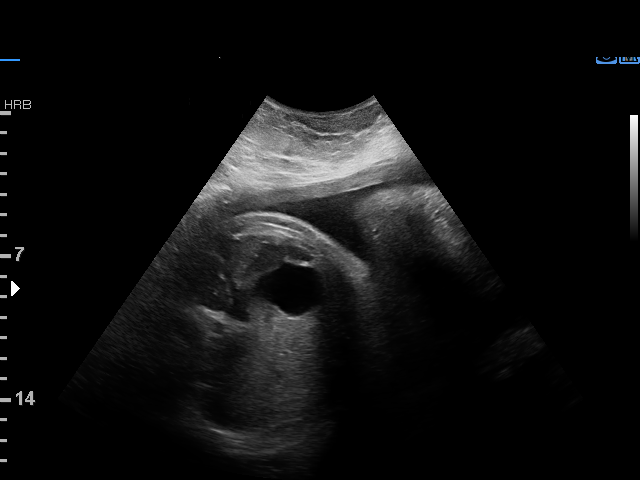
[im 18/28]
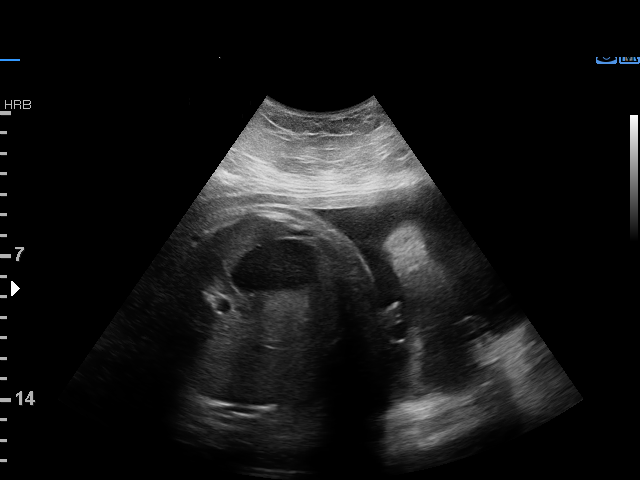
[im 20/28]
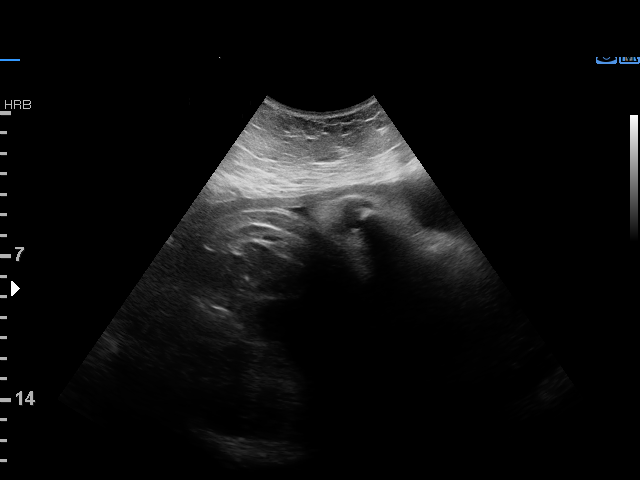
[im 22/28]
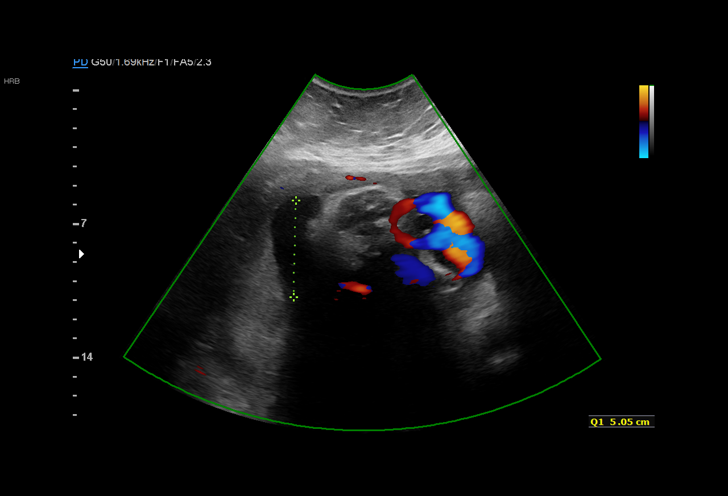
[im 24/28]
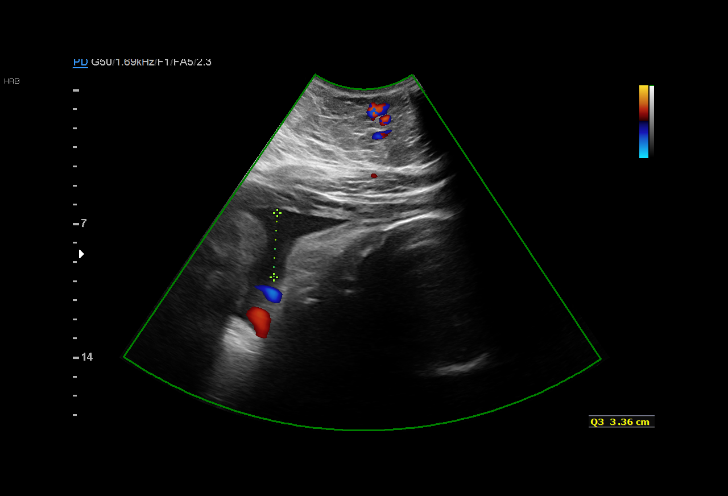
[im 26/28]
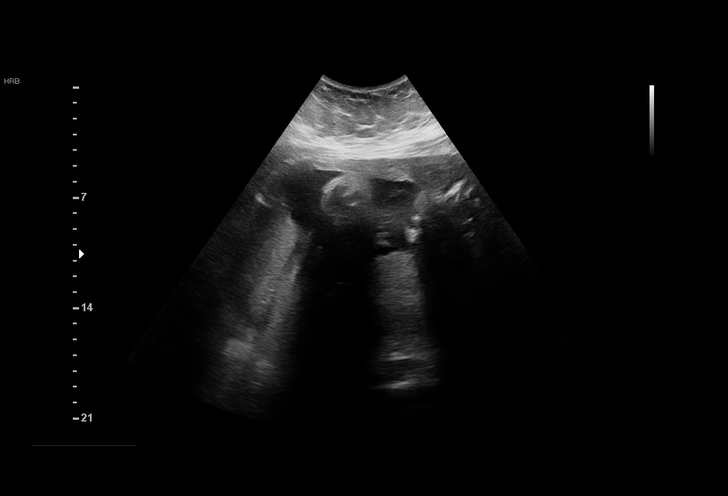
[im 28/28]
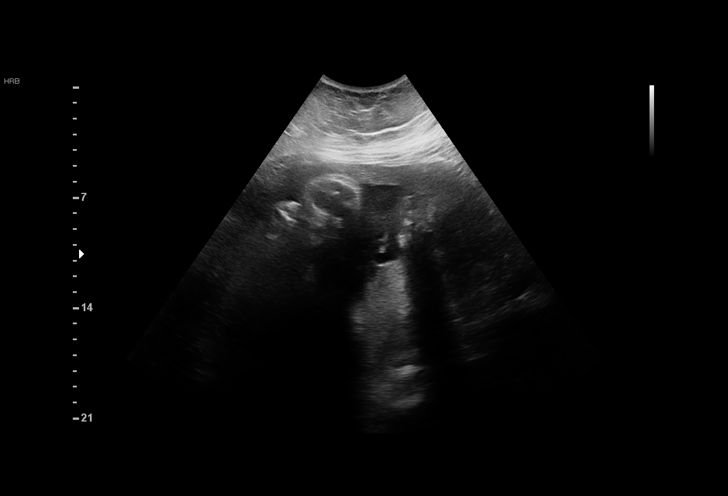

[14 of 28 positions shown; findings below may reference images not displayed]

OB/Gyn Clinic

W/NONSTRESS

Indications

Poor obstetric history: Previous gestational
diabetes - insulin ([DATE]-Hgb A1c
Prediabetes-diet)
Gestational diabetes in pregnancy, diet
controlled (poorly controlled)
Advanced maternal age multigravida 35+,
third trimester
36 weeks gestation of pregnancy
Vital Signs

BMI:
Fetal Evaluation

Num Of Fetuses:         1
Fetal Heart Rate(bpm):  141
Cardiac Activity:       Observed
Presentation:           Cephalic
Placenta:               Posterior
P. Cord Insertion:      Previously Visualized
Amniotic Fluid
AFI FV:      Within normal limits

AFI Sum(cm)     %Tile       Largest Pocket(cm)
10.46           27

RUQ(cm)       RLQ(cm)       LUQ(cm)        LLQ(cm)
5.05          2.05          0
Biophysical Evaluation

Amniotic F.V:   Within normal limits       F. Tone:        Observed
F. Movement:    Observed                   Score:          [DATE]
F. Breathing:   Observed
Biometry

BPD:      85.3  mm     G. Age:  34w 2d          8  %    CI:        81.08   %    70 - 86
FL/HC:      23.5   %    20.8 -
HC:      299.1  mm     G. Age:  33w 1d        < 3  %    HC/AC:      0.77        0.92 -
AC:      387.1  mm     G. Age:  N/A          > 97  %    FL/BPD:     82.3   %    71 - 87
FL:       70.2  mm     G. Age:  36w 0d         30  %    FL/AC:      18.1   %    20 - 24
HUM:      62.1  mm     G. Age:  36w 0d         54  %
Est. FW:    6600  gm      8 lb 2 oz   > 90  %
OB History

Gravidity:    2         Term:   1
Living:       1
Gestational Age

LMP:           36w 5d        Date:  05/18/17                 EDD:   02/22/18
U/S Today:     34w 3d                                        EDD:   03/10/18
Best:          36w 5d     Det. By:  LMP  (05/18/17)          EDD:   02/22/18
Anatomy

Cranium:               Appears normal         Aortic Arch:            Not well visualized
Cavum:                 Appears normal         Ductal Arch:            Not well visualized
Ventricles:            Previously seen        Diaphragm:              Previously seen
Choroid Plexus:        Previously seen        Stomach:                Appears normal, left
sided
Cerebellum:            Previously seen        Abdomen:                Appears normal
Posterior Fossa:       Previously seen        Abdominal Wall:         Previously seen
Nuchal Fold:           Not applicable (>20    Cord Vessels:           Previously seen
wks GA)
Face:                  Orbits and profile     Kidneys:                Previously seen
previously seen
Lips:                  Previously seen        Bladder:                Previously seen
Thoracic:              Appears normal         Spine:                  Previously seen
Heart:                 Previously seen        Upper Extremities:      Previously seen
RVOT:                  Previously seen        Lower Extremities:      Previously seen
LVOT:                  Previously seen

Other:  Male gender prev seen. Technically difficult due to maternal habitus
and fetal position.
Cervix Uterus Adnexa
Cervix
Not visualized (advanced GA >90wks)
Impression

Normal interval growth.
EFW 90% with AC 97%.
Recommendations

Continue serial growth every 3-4 weeks
Continue weekly testing with BPP
